# Patient Record
Sex: Female | Born: 1940 | Race: White | Hispanic: No | Smoking: Never smoker
Health system: Southern US, Community
[De-identification: ages and names within clinical notes are randomized; demographics above are authoritative.]

## PROBLEM LIST (undated history)

## (undated) DIAGNOSIS — M545 Low back pain, unspecified: Secondary | ICD-10-CM

## (undated) DIAGNOSIS — T7840XA Allergy, unspecified, initial encounter: Secondary | ICD-10-CM

## (undated) DIAGNOSIS — H269 Unspecified cataract: Secondary | ICD-10-CM

## (undated) DIAGNOSIS — Z9889 Other specified postprocedural states: Secondary | ICD-10-CM

## (undated) DIAGNOSIS — M2550 Pain in unspecified joint: Secondary | ICD-10-CM

## (undated) DIAGNOSIS — G4700 Insomnia, unspecified: Secondary | ICD-10-CM

## (undated) DIAGNOSIS — H9319 Tinnitus, unspecified ear: Secondary | ICD-10-CM

## (undated) DIAGNOSIS — R112 Nausea with vomiting, unspecified: Secondary | ICD-10-CM

## (undated) DIAGNOSIS — K219 Gastro-esophageal reflux disease without esophagitis: Secondary | ICD-10-CM

## (undated) DIAGNOSIS — R51 Headache: Secondary | ICD-10-CM

## (undated) DIAGNOSIS — E039 Hypothyroidism, unspecified: Secondary | ICD-10-CM

## (undated) DIAGNOSIS — I491 Atrial premature depolarization: Secondary | ICD-10-CM

## (undated) DIAGNOSIS — D649 Anemia, unspecified: Secondary | ICD-10-CM

## (undated) DIAGNOSIS — J189 Pneumonia, unspecified organism: Secondary | ICD-10-CM

## (undated) DIAGNOSIS — M1990 Unspecified osteoarthritis, unspecified site: Secondary | ICD-10-CM

## (undated) DIAGNOSIS — M2540 Effusion, unspecified joint: Secondary | ICD-10-CM

## (undated) HISTORY — DX: Anemia, unspecified: D64.9

## (undated) HISTORY — PX: CARPAL TUNNEL RELEASE: SHX101

## (undated) HISTORY — PX: CATARACT EXTRACTION: SUR2

## (undated) HISTORY — DX: Headache: R51

## (undated) HISTORY — DX: Allergy, unspecified, initial encounter: T78.40XA

## (undated) HISTORY — DX: Hypothyroidism, unspecified: E03.9

## (undated) HISTORY — DX: Unspecified cataract: H26.9

## (undated) HISTORY — DX: Gastro-esophageal reflux disease without esophagitis: K21.9

## (undated) HISTORY — DX: Low back pain: M54.5

## (undated) HISTORY — PX: ESOPHAGOGASTRODUODENOSCOPY: SHX1529

## (undated) HISTORY — PX: CHOLECYSTECTOMY: SHX55

## (undated) HISTORY — PX: TUBAL LIGATION: SHX77

## (undated) HISTORY — DX: Low back pain, unspecified: M54.50

---

## 2002-07-03 ENCOUNTER — Inpatient Hospital Stay (HOSPITAL_COMMUNITY): Admission: AD | Admit: 2002-07-03 | Discharge: 2002-07-09 | Payer: Self-pay | Admitting: Gastroenterology

## 2002-07-03 ENCOUNTER — Encounter: Payer: Self-pay | Admitting: Gastroenterology

## 2002-07-06 ENCOUNTER — Encounter: Payer: Self-pay | Admitting: Gastroenterology

## 2005-06-17 ENCOUNTER — Ambulatory Visit (HOSPITAL_BASED_OUTPATIENT_CLINIC_OR_DEPARTMENT_OTHER): Admission: RE | Admit: 2005-06-17 | Discharge: 2005-06-18 | Payer: Self-pay | Admitting: Orthopedic Surgery

## 2009-11-19 ENCOUNTER — Ambulatory Visit (HOSPITAL_BASED_OUTPATIENT_CLINIC_OR_DEPARTMENT_OTHER): Admission: RE | Admit: 2009-11-19 | Discharge: 2009-11-19 | Payer: Self-pay | Admitting: Orthopedic Surgery

## 2010-06-27 LAB — BASIC METABOLIC PANEL
BUN: 19 mg/dL (ref 6–23)
CO2: 22 mEq/L (ref 19–32)
Calcium: 8.6 mg/dL (ref 8.4–10.5)
Chloride: 109 mEq/L (ref 96–112)
Creatinine, Ser: 0.79 mg/dL (ref 0.4–1.2)
GFR calc Af Amer: >60 mL/min (ref >60)
GFR calc non Af Amer: >60 mL/min (ref >60)
Glucose, Bld: 124 mg/dL — ABNORMAL HIGH (ref 70–99)
Potassium: 3.9 mEq/L (ref 3.5–5.1)
Sodium: 137 mEq/L (ref 135–145)

## 2010-06-27 LAB — POCT HEMOGLOBIN-HEMACUE: Hemoglobin: 12.4 g/dL (ref 12.0–15.0)

## 2010-08-29 NOTE — Op Note (Signed)
NAMETRULA, FREDE                ACCOUNT NO.:  [id number]   MEDICAL RECORD NO.:  [id number]          PATIENT TYPE:  AMB   LOCATION:  DSC                          FACILITY:  [facility]   PHYSICIAN:  Cindee Salt, M.D.       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   OPERATION:  Decompression, left median nerve; release of flexor carpi  radialis; debridement of STT joint of left wrist.   SURGEON:  Cindee Salt, M.D.   ANESTHESIA:  General.   HISTORY:  The patient is a 70 year old female with a history of wrist pain,  numbness, tingling, EMG nerve conductions positive. She has STT arthritis  and symptoms of flexor carpi radialis tendonitis.   PROCEDURE:  The patient was brought to the operating room, where a general  anesthetic was carried out without difficulty. She was prepped using  DuraPrep in the supine position, with left arm free. The limb was  exsanguinated with an Esmarch bandage; tourniquet placed high on the arm and  was inflated to 150 mmHg. An incision was made in the palm, carried down  into the distal forearm for release of the carpal tunnel. The wound was  deepened.  The flexor retinaculum was identified proximally. The distal  forearm fascia was released.  A release was then formed to the ulnar side of  median nerve, from the main proximal to distal direction under direct  vision.  The nerve was released, and an hourglass deformity was apparent to  the nerve. The dissection was then carried underneath the carpal  retinaculum.  The palmar cutaneous branch of the median nerve was  identified. The flexor carpi radialis was then identified, opened from its  sheath and  released along its course from the ulnar side, taking care to  protect the motor branch of the median nerve. The tendon was lifted; a  significant area of fibrillation was present, with a large spicule of bone  present from the STT joint. This was debrided with a rongeur until smooth;  no further lesions were identified. The entire flexor carpi radialis was  released. The wound was then copiously irrigated with saline. The skin  closed with interrupted 5-0 nylon sutures. Sterile compressive dressing and  splint was applied. The patient tolerated the procedure well, and was taken  to the recovery room for observation in satisfactory condition. She was  discharged home, to return to the [facility] in 1 week on  Vicodin.           ______________________________  Cindee Salt, M.D.     GK/MEDQ  D:  06/17/2005  T:  06/18/2005  Job:  [id number]

## 2010-08-29 NOTE — Discharge Summary (Signed)
Frances Robertson, Frances Robertson                          ACCOUNT NO.:  [id number]   MEDICAL RECORD NO.:  [id number]                   PATIENT TYPE:  INP   LOCATION:  5152                                 FACILITY:  [facility]   PHYSICIAN:  Wilhemina Bonito. Marina Goodell, M.D. [facility]             DATE OF BIRTH:  January 06, 1941   DATE OF ADMISSION:  07/03/2002  DATE OF DISCHARGE:  07/09/2002                                 DISCHARGE SUMMARY   ADMISSION DIAGNOSES:  1. Post endoscopic retrograde cholangiopancreatography pancreatitis.  2. History of ampullary stenosis, status post endoscopic retrograde     cholangiopancreatography with sphincterotomy on July 03, 2002.  3. Mild hyperglycemia.  4. Hypothyroidism.  5. Chronic sinusitis, status post sinus surgery.  6. Mild depression.  7. Status post bilateral tubal ligation.  8. Status post cholecystectomy in 1997.  9. Normocytic anemia.  10.      Recent urinary tract infection diagnosed March 5.  The patient     poorly compliant with prescription for Keflex to treat urinary tract     infection.   DISCHARGE DIAGNOSIS:  Post endoscopic retrograde cholangiopancreatography  pancreatitis, resolved.   CONSULTATIONS:  None.   PROCEDURES:  She had the endoscopic retrograde cholangiopancreatography with  sphincterotomy and balloon pull-through on July 03, 2002.  Cholangiogram  findings included diffuse ductal dilatation involving common bile duct and  common hepatic duct.  The ampullary orifice was small, difficult to  cannulate, and distal common bile duct was tortuous.  There was good biliary  drainage following the sphincterotomy.   BRIEF HISTORY:  The patient is a pleasant and generally healthy 70 year old  white female who had been referred by her primary care physician, Dr. Lewis Moccasin, to [facility].  Going back the last few months, the patient had been  having intermittent epigastric pain, nausea, and vomiting.  On ER visit, she  had documented abnormal LFT's,  amylase, and lipase.  She had undergone  cholecystectomy more than 10 years previously.  A CT scan was obtained and  showed some dilation of the common bile duct with tapering as it approached  the ampulla.  She was referred to Dr. Jarold Motto for evaluation and  ultimately to Dr. Russella Dar for an ERCP.  That study was performed on the  morning of March 22.  Following the uncomplicated procedure, she developed  upper abdominal pain and was admitted for presumed post ERCP pancreatitis.   LABORATORY DATA:  Hemoglobin 10.8, hematocrit 30.9, white blood cell count  5.4, platelets of 152.  Maximum white blood cell count was 11.4.  Differential did reveal left shift with increased neutrophils and diminished  lymphocytes.  Sodium nadir of 131.  It was 135 at discharge.  Potassium  nadir of 3.4.  It was 4.4 at discharge.  Glucose ranged from 118 up to 154.  BUN on admission was 15, creatinine 0.6; at discharge 4 and 0.6,  respectively.  Calcium  low at 8.1.  Albumin low at 2.4.  Total bilirubin was  0.5 to 0.7.  Alkaline phosphatase ranged 77 to 111.  AST peak of 187, 30 at  discharge.  ALT peak of 286, 72 at discharge.  Amylase peak of 2901, 83 at  discharge.  Lipase peak 2155, 29 at discharge.  Urinalysis was negative with  the exception of 3-6 white blood cells per high-powered field.  On urine  culture, multiple species grew out with no uropathogens isolated.   HOSPITAL COURSE:  The patient was admitted to a nonmonitored unit for bowel  rest, IV hydration, and analgesics and antiemetics as needed.  Amylase and  lipase were elevated and confirmed the presumed diagnosis on admission of  post ERCP pancreatitis.  Both the amylase and lipase and the LFT's were  followed for the course of her hospitalization and peaked and then  ultimately were at or near normal by the time of discharge.   Within 24 hours, the patient was feeling a little bit better but was still  quite tender and uncomfortable.  By  hospital day #3, pain was better and she  was hungry but she was kept on clears for the time being.  Hospital day #3,  #4 and #5 she spiked temperatures as high as 101.3.  Urine cultures were  unremarkable.  Chest films and abdominal films did not confirm any active  lung infiltrate.  Ultimately, the fevers resolved.   The patient's pain gradually improved to the point where we were able to  advance her from clear liquids to a low-fat diet, all of which she  tolerated.  By March 28, she had not required much in the way of narcotic  analgesic.  She was stable and deemed fit for discharge in improved  condition by Dr. Marina Goodell.   The patient was discharged on a day when the office was closed and therefore  she was advised that she would be contacted with an appointment to return to  see Dr. Russella Dar within the next couple of weeks.  Microcytic anemia has been  noted during this admission and should be addressed when she comes back to  see Dr. Russella Dar in the office, at which time a colonoscopy can be arranged.  She has never undergone colonoscopic evaluation before.   DISCHARGE MEDICATIONS:  1. Prevacid 30 mg p.o. daily.  2. Synthroid 125 mcg p.o. daily.  3. Trazodone 100 mg p.o. at h.s.  4. Calcium 1000 mg daily.  5. Also suggested was a multivitamin to be taken daily.   DIET:  Low fat.   PAIN MANAGEMENT:  She could use Tylenol regular or extra strength as needed.   ACTIVITY:  She was okay to return to light duty work April 1 through 7 and  to full duty work on April 8.     Brett Canales, P.A. [facility]                    John N. Marina Goodell, M.D. [facility]    SG/MEDQ  D:  08/08/2002  T:  08/09/2002  Job:  [id number]   cc:   Lewis Moccasin  [address]  [place] [address]  Fax: [phone]

## 2010-08-29 NOTE — H&P (Signed)
Frances Robertson, Frances Robertson                          ACCOUNT NO.:  [id number]   MEDICAL RECORD NO.:  [id number]                   PATIENT TYPE:  INP   LOCATION:  5508                                 FACILITY:  [facility]   PHYSICIAN:  Malcolm T. Russella Dar, M.D. [facility]          DATE OF BIRTH:  02/25/1941   DATE OF ADMISSION:  07/03/2002  DATE OF DISCHARGE:                                HISTORY & PHYSICAL   CHIEF COMPLAINT:  Intermittent attacks of epigastric pain.   HISTORY OF PRESENT ILLNESS:  The patient is a 70 year old white female who  underwent ERCP and sphincterotomy this morning for evaluation of  intermittent epigastric pain, nausea, and vomiting.  These attacks have been  rather severe and associated with documented abnormal LFTs, amylase, and  lipase in the past.  She has had several ER visits over the past year for  these episodes.  She is status post cholecystectomy in 1997.  She underwent  recent CT scan per her primary physician, Dr. Lewis Moccasin, in [place] in  January 2004 which did show a common bile duct of 1 cm with tapering near  the ampulla.  This exam was otherwise negative.  She was seen in the office  by Dr. Jarold Motto for GI consultation and then referred to Dr. Russella Dar for  ERCP.   ERCP today consistent with ampullary stenosis with difficult cannulation.  Balloon pull-through was done x 3 and sphincterotomy.  The patient developed  upper abdominal pain after the procedure and is admitted at this time for  observation and pain control with concern for post ERCP pancreatitis.  At  this time, the patient is still sedated and not offering much history.   CURRENT MEDICATIONS:  1. Prevacid 30 mg p.o. daily.  2. Synthroid 125 mcg daily.  3. Trazodone 100 mg q.h.s.  4. Keflex for UTI.   ALLERGIES:  SELDANE.   PAST MEDICAL HISTORY:  1. Chronic sinusitis.  She has had a prior sinus surgery.  2. Hypothyroidism.  3. Mild depression.  4. Status post bilateral tubal ligation.  5. Status post cholecystectomy in 1997.   FAMILY HISTORY:  Negative for GI disease.   SOCIAL HISTORY:  The patient is divorced; son lives with her.  She is  employed full time.  She is an ex-smoker and no regular ETOH.   REVIEW OF SYSTEMS:  Unable to obtain at this time in detail due to her  sedated state.   PHYSICAL EXAMINATION:  GENERAL:  Well-developed white female who is somewhat  groggy but able to be aroused.  VITAL SIGNS:  Temperature 97.8, blood pressure 147/100, pulse 90.  HEENT:  PERRLA.  EOMI.  Sclerae anicteric.  NECK:  Supple without nodes.  There is no JVD.  CARDIOVASCULAR:  Regular rate and rhythm with S1 and S2.  PULMONARY:  Clear to auscultation and percussion.  ABDOMEN:  Soft. Bowel sounds are present but hypoactive.  She is tender  across the upper abdomen with some guarding.  There is no mass or  hepatosplenomegaly.  RECTAL:  Exam not done at this time.  EXTREMITIES:  Without clubbing, cyanosis, or edema.   IMPRESSION:  23. A 70 year old white female with abdominal pain post endoscopic retrograde     cholangiopancreatography and sphincterotomy done for ampullary stenosis.     Rule out post endoscopic retrograde cholangiopancreatography     pancreatitis.  2. Status post cholecystectomy in 1997.  3. Gastroesophageal reflux disease.  4. Status post bilateral tubal ligation.  5. Hypothyroidism.  6. Depression.   PLAN:  The patient is admitted to the service fo Dr .Claudette Head for IV  fluid hydration, pain control, bowel rest, and baseline labs.  For further  details, please see the orders.     Amy Esterwood, P.A.-C. [facility]                Malcolm T. Russella Dar, M.D. [facility]    AE/MEDQ  D:  07/03/2002  T:  07/03/2002  Job:  [id number]   cc:   Lewis Moccasin, M.D.  [facility]

## 2012-03-14 ENCOUNTER — Ambulatory Visit: Payer: Self-pay | Admitting: Cardiology

## 2012-03-18 ENCOUNTER — Ambulatory Visit (INDEPENDENT_AMBULATORY_CARE_PROVIDER_SITE_OTHER): Payer: Medicare Other | Admitting: Cardiology

## 2012-03-18 ENCOUNTER — Encounter: Payer: Self-pay | Admitting: Cardiology

## 2012-03-18 VITALS — BP 144/80 | HR 73 | Ht 62.0 in | Wt 130.1 lb

## 2012-03-18 DIAGNOSIS — I491 Atrial premature depolarization: Secondary | ICD-10-CM

## 2012-03-18 NOTE — Patient Instructions (Addendum)
The current medical regimen is effective;  continue present plan and medications.  Follow up as needed 

## 2012-03-18 NOTE — Progress Notes (Signed)
HPI The patient presents for evaluation of premature atrial contractions. She unfortunately suffered the loss of her 72 year old son who died suddenly this. There was no autopsy the cause of this was not clear per the patient. She was noticed recently on an EKG to have premature atrial contractions. She occasionally seems to feel some palpitations but she doesn't have any severe symptoms related to this. She does not describe presyncope or syncope. She does not have chest pressure, neck or arm discomfort. She works full-time. She has never had shortness of breath and has no PND or orthopnea. She has never had reason for stress testing or other cardiac evaluation.  Allergies  Allergen Reactions  . Celandine (Chelidonium Majus)   . Celebrex (Celecoxib)   . Neosporin (Neomycin-Bacitracin Zn-Polymyx)     Current Outpatient Prescriptions  Medication Sig Dispense Refill  . atenolol (TENORMIN) 50 MG tablet Take 50 mg by mouth daily.      Marland Kitchen gabapentin (NEURONTIN) 300 MG capsule Take 300 mg by mouth 3 (three) times daily.      Marland Kitchen levothyroxine (SYNTHROID, LEVOTHROID) 88 MCG tablet Take 88 mcg by mouth daily.      Marland Kitchen omeprazole (PRILOSEC) 40 MG capsule Take 40 mg by mouth daily.      . traMADol (ULTRAM) 50 MG tablet Take 50 mg by mouth 2 (two) times daily. Take 2 tabs bid for 5 days      . zolpidem (AMBIEN) 10 MG tablet Take 10 mg by mouth at bedtime as needed.      . [DISCONTINUED] omeprazole (PRILOSEC) 20 MG capsule Take 20 mg by mouth daily.        Past Medical History  Diagnosis Date  . Hypertension   . GERD (gastroesophageal reflux disease)   . Other specified disorders of thyroid   . Unspecified hypothyroidism   . Anemia, unspecified   . Cardiac dysrhythmia, unspecified   . Contact dermatitis and other eczema, due to unspecified cause     Past Surgical History  Procedure Date  . Diagnostic mammogram 2010    Family History  Problem Relation Age of Onset  . Heart attack Mother   .  Stroke Father   . Cancer Other     History   Social History  . Marital Status: Married    Spouse Name: N/A    Number of Children: N/A  . Years of Education: N/A   Occupational History  . Not on file.   Social History Main Topics  . Smoking status: Never Smoker   . Smokeless tobacco: Not on file  . Alcohol Use: No  . Drug Use: No  . Sexually Active:    Other Topics Concern  . Not on file   Social History Narrative  . No narrative on file    ROS:  Positive for joint pains. Otherwise as stated in the HPI and negative for all other systems.  PHYSICAL EXAM BP 144/80  Pulse 73  Ht 5\' 2"  (1.575 m)  Wt 130 lb 1.9 oz (59.022 kg)  BMI 23.80 kg/m2 GENERAL:  Well appearing HEENT:  Pupils equal round and reactive, fundi not visualized, oral mucosa unremarkable NECK:  No jugular venous distention, waveform within normal limits, carotid upstroke brisk and symmetric, no bruits, no thyromegaly LYMPHATICS:  No cervical, inguinal adenopathy LUNGS:  Clear to auscultation bilaterally BACK:  No CVA tenderness CHEST:  Unremarkable HEART:  PMI not displaced or sustained,S1 and S2 within normal limits, no S3, no S4, no clicks, no rubs,  no murmurs ABD:  Flat, positive bowel sounds normal in frequency in pitch, no bruits, no rebound, no guarding, no midline pulsatile mass, no hepatomegaly, no splenomegaly EXT:  2 plus pulses throughout, no edema, no cyanosis no clubbing SKIN:  No rashes no nodules NEURO:  Cranial nerves II through XII grossly intact, motor grossly intact throughout PSYCH:  Cognitively intact, oriented to person place and time  EKG:  NSR, rate 71, axis WNL, intervals WNL, poor anterior R wave progression, borderline low voltage on the chest leads.  ASSESSMENT AND PLAN   Atrial premature beats - The patient has no symptoms related to this. She has a normal physical examination. Her EKG demonstrates only borderline findings. I do not think this is related to the sudden  death of her son (that is I do not suggest that this is an inherited problem) although it certainly could be related to the stress of that situation. At this point we had a long discussion about this but I do not think further cardiovascular testing is warranted.

## 2012-03-21 DIAGNOSIS — I491 Atrial premature depolarization: Secondary | ICD-10-CM | POA: Insufficient documentation

## 2012-09-14 ENCOUNTER — Encounter: Payer: Self-pay | Admitting: Nurse Practitioner

## 2012-09-14 ENCOUNTER — Ambulatory Visit (INDEPENDENT_AMBULATORY_CARE_PROVIDER_SITE_OTHER): Payer: Medicare Other | Admitting: Nurse Practitioner

## 2012-09-14 VITALS — BP 133/67 | HR 60 | Ht 62.5 in | Wt 132.0 lb

## 2012-09-14 DIAGNOSIS — G43019 Migraine without aura, intractable, without status migrainosus: Secondary | ICD-10-CM | POA: Insufficient documentation

## 2012-09-14 NOTE — Progress Notes (Signed)
HPI: Patient returns for followup after last visit with Dr. Anne Hahn 03/02/2012 for headache. At that time her son who died suddenly and unexpectedly and she had been under a lot of stress. She was  also having episodes of irregular heartbeat but she did not have any syncopal episodes. She was having difficulty sleeping at night and a lot of anxiety during the day. She was evaluated by cardiology who felt no further workup was needed.  She continues to work. She reports today that her headache preventatives are beneficial in that she is doing better.  ROS:  Insomnia,  headache  Physical Exam General: well developed, well nourished, seated, in no evident distress Head: head normocephalic and atraumatic. Oropharynx benign Neck: supple with no carotid  bruits Cardiovascular: regular rate and rhythm, no murmurs  Neurologic Exam Mental Status: Awake and fully alert. Oriented to place and time. Follows all commands. Speech and language normal.   Cranial Nerves: Pupils equal, briskly reactive to light. Extraocular movements full without nystagmus. Visual fields full to confrontation. Hearing intact and symmetric to finger snap.  Face, tongue, palate move normally and symmetrically. Neck flexion and extension normal.  Motor: Normal bulk and tone. Normal strength in all tested extremity muscles.No focal weakness Sensory.: intact to touch and pinprick and vibratory.  Coordination: Rapid alternating movements normal in all extremities. Finger-to-nose and heel-to-shin performed accurately bilaterally. Gait and Station: Arises from chair without difficulty. Stance is normal. Gait demonstrates normal stride length and balance . Able to heel, toe and tandem walk without difficulty.  Reflexes: 2+ and symmetric. Toes downgoing.     ASSESSMENT: History of headache doing well on gabapentin and Topamax     PLAN: Will continue medications, she says she does not need refills She will f/u yearly  Frances Robertson, GNP-BC APRN

## 2012-09-14 NOTE — Progress Notes (Signed)
I have read the note, and I agree with the clinical assessment and plan.  

## 2012-09-14 NOTE — Patient Instructions (Addendum)
Continue current medications as previously ordered by Dr. Anne Hahn Follow up 1 yr and prn

## 2012-09-30 ENCOUNTER — Other Ambulatory Visit: Payer: Self-pay | Admitting: Neurology

## 2012-10-03 NOTE — Telephone Encounter (Signed)
Dr Anne Hahn is out of the office, forwarding request to Dr Terrace Arabia, [facility]

## 2012-11-10 ENCOUNTER — Other Ambulatory Visit: Payer: Self-pay | Admitting: Neurology

## 2012-11-12 ENCOUNTER — Other Ambulatory Visit: Payer: Self-pay | Admitting: Neurology

## 2012-12-19 ENCOUNTER — Other Ambulatory Visit: Payer: Self-pay | Admitting: Neurology

## 2012-12-20 ENCOUNTER — Other Ambulatory Visit: Payer: Self-pay

## 2012-12-20 MED ORDER — GABAPENTIN 300 MG PO CAPS: 300.0000 mg | ORAL_CAPSULE | Freq: Four times a day (QID) | ORAL | Status: DC

## 2012-12-20 MED ORDER — TRAMADOL HCL 50 MG PO TABS: 50.0000 mg | ORAL_TABLET | ORAL | Status: DC | PRN

## 2012-12-20 MED ORDER — DIAZEPAM 5 MG PO TABS: 5.0000 mg | ORAL_TABLET | Freq: Two times a day (BID) | ORAL | Status: DC

## 2012-12-20 MED ORDER — TOPIRAMATE 100 MG PO TABS: 100.0000 mg | ORAL_TABLET | Freq: Every evening | ORAL | Status: DC

## 2012-12-20 NOTE — Telephone Encounter (Signed)
Rx's signed and faxed.

## 2013-04-04 ENCOUNTER — Ambulatory Visit: Payer: Medicare Other | Admitting: Podiatrist

## 2013-06-26 ENCOUNTER — Other Ambulatory Visit: Payer: Self-pay | Admitting: Neurology

## 2013-06-26 NOTE — Telephone Encounter (Signed)
Rx signed and faxed.

## 2013-08-07 ENCOUNTER — Telehealth: Payer: Self-pay | Admitting: Cardiology

## 2013-08-07 NOTE — Telephone Encounter (Signed)
Per Dr Hochrein's review - In the absence of symptoms no further work up is needed.

## 2013-08-07 NOTE — Telephone Encounter (Signed)
New message     Get we get the ekg's faxed last week?  Patient had EKG's changes

## 2013-08-07 NOTE — Telephone Encounter (Signed)
Office visit note and EKG's were received and given to Dr Percival Spanish for review.  Of note -EKG's received and those from 2012-13 are very similar.

## 2013-08-07 NOTE — Telephone Encounter (Signed)
Randy aware.

## 2013-09-14 ENCOUNTER — Ambulatory Visit (INDEPENDENT_AMBULATORY_CARE_PROVIDER_SITE_OTHER): Payer: Medicare Other | Admitting: Neurology

## 2013-09-14 ENCOUNTER — Encounter: Payer: Self-pay | Admitting: Neurology

## 2013-09-14 ENCOUNTER — Encounter (INDEPENDENT_AMBULATORY_CARE_PROVIDER_SITE_OTHER): Payer: Self-pay

## 2013-09-14 VITALS — Ht 62.25 in | Wt 135.0 lb

## 2013-09-14 DIAGNOSIS — G43019 Migraine without aura, intractable, without status migrainosus: Secondary | ICD-10-CM

## 2013-09-14 NOTE — Progress Notes (Signed)
Reason for visit: Headache  Frances Robertson is an 73 y.o. female  History of present illness:  Frances Robertson is a 73 year old white female with a history of migraine headaches. She indicates that over the last year, she has had a lot of allergy symptoms and sinusitis issues. She has been on several courses of antibiotics, and she has been set up for sinus x-rays, but she never had x-rays done. With the sinus issues, she has had some frontal headaches and some dizziness. Otherwise, she indicates that her migraines are under relatively good control, having only 2 or 3 headaches a month. She is on Topamax at 100 mg dosing at night. She is tolerating the medication well. She takes gabapentin as well taking 300 mg 4 times daily. She believes this also helps her headache. She returns for an evaluation.  Past Medical History  Diagnosis Date  . Hypertension     Borderline  . GERD (gastroesophageal reflux disease)   . Unspecified hypothyroidism   . Anemia, unspecified   . Contact dermatitis and other eczema, due to unspecified cause   . [id number](829.9)     Past Surgical History  Procedure Laterality Date  . Carpal tunnel release      bilateral  . Cholecystectomy    . Cataract extraction      bilateral  . Tubal ligation Bilateral     Family History  Problem Relation Age of Onset  . Venous thrombosis Mother 3  . Stroke Father 39    Sisters x 2 with breast cancer, brothers x 2 died with lung cancer  . Cancer Other   . Sudden death Son 67  . Cancer - Lung Brother   . Cancer - Lung Brother     Social history:  reports that she has never smoked. She has never used smokeless tobacco. She reports that she does not drink alcohol or use illicit drugs.    Allergies  Allergen Reactions  . Celandine [Chelidonium Majus]   . Celebrex [Celecoxib]   . Neosporin [Neomycin-Bacitracin Zn-Polymyx]     Medications:  Current Outpatient Prescriptions on File Prior to Visit  Medication Sig  Dispense Refill  . atenolol (TENORMIN) 50 MG tablet Take 50 mg by mouth daily.      Marland Kitchen gabapentin (NEURONTIN) 300 MG capsule Take 1 capsule (300 mg total) by mouth 4 (four) times daily.  360 capsule  3  . levothyroxine (SYNTHROID, LEVOTHROID) 88 MCG tablet Take 88 mcg by mouth daily.      Marland Kitchen omeprazole (PRILOSEC) 40 MG capsule Take 40 mg by mouth daily.      Marland Kitchen topiramate (TOPAMAX) 100 MG tablet Take 1 tablet (100 mg total) by mouth every evening.  90 tablet  3  . traMADol (ULTRAM) 50 MG tablet TAKE 1 TABLET BY MOUTH EVERY 4 HOURS AS NEEDED  180 tablet  1  . zolpidem (AMBIEN) 10 MG tablet Take 10 mg by mouth at bedtime as needed.       No current facility-administered medications on file prior to visit.    ROS:  Out of a complete 14 system review of symptoms, the patient complains only of the following symptoms, and all other reviewed systems are negative.  Chest pain Insomnia Headache  Height 5' 2.25" (1.581 m), weight 135 lb (61.236 kg).  Physical Exam  General: The patient is alert and cooperative at the time of the examination.  Skin: No significant peripheral edema is noted.   Neurologic Exam  Mental status: The  patient is oriented x 3.  Cranial nerves: Facial symmetry is present. Speech is normal, no aphasia or dysarthria is noted. Extraocular movements are full. Visual fields are full.  Motor: The patient has good strength in all 4 extremities.  Sensory examination: Soft touch sensation is symmetric on the face, arms, and legs.  Coordination: The patient has good finger-nose-finger and heel-to-shin bilaterally.  Gait and station: The patient has a normal gait. Tandem gait is normal. Romberg is negative. No drift is seen.  Reflexes: Deep tendon reflexes are symmetric.   Assessment/Plan:  1. Migraine headache  2. Chronic sinusitis  The patient may need to get the sinus issue evaluated and properly treated. The patient is doing well with her migraine headaches, we  will continue the Topamax for now, the patient will followup in 6-8 months.  Frances Alexanders MD 09/14/2013 5:50 PM  [facility] [address] [place], [place] [address]  Phone [phone] Fax [phone]

## 2013-09-14 NOTE — Patient Instructions (Signed)
Migraine Headache A migraine headache is an intense, throbbing pain on one or both sides of your head. A migraine can last for 30 minutes to several hours. CAUSES  The exact cause of a migraine headache is not always known. However, a migraine may be caused when nerves in the brain become irritated and release chemicals that cause inflammation. This causes pain. Certain things may also trigger migraines, such as:  Alcohol.  Smoking.  Stress.  Menstruation.  Aged cheeses.  Foods or drinks that contain nitrates, glutamate, aspartame, or tyramine.  Lack of sleep.  Chocolate.  Caffeine.  Hunger.  Physical exertion.  Fatigue.  Medicines used to treat chest pain (nitroglycerine), birth control pills, estrogen, and some blood pressure medicines. SIGNS AND SYMPTOMS  Pain on one or both sides of your head.  Pulsating or throbbing pain.  Severe pain that prevents daily activities.  Pain that is aggravated by any physical activity.  Nausea, vomiting, or both.  Dizziness.  Pain with exposure to bright lights, loud noises, or activity.  General sensitivity to bright lights, loud noises, or smells. Before you get a migraine, you may get warning signs that a migraine is coming (aura). An aura may include:  Seeing flashing lights.  Seeing bright spots, halos, or zig-zag lines.  Having tunnel vision or blurred vision.  Having feelings of numbness or tingling.  Having trouble talking.  Having muscle weakness. DIAGNOSIS  A migraine headache is often diagnosed based on:  Symptoms.  Physical exam.  A CT scan or MRI of your head. These imaging tests cannot diagnose migraines, but they can help rule out other causes of headaches. TREATMENT Medicines may be given for pain and nausea. Medicines can also be given to help prevent recurrent migraines.  HOME CARE INSTRUCTIONS  Only take over-the-counter or prescription medicines for pain or discomfort as directed by your  health care provider. The use of long-term narcotics is not recommended.  Lie down in a dark, quiet room when you have a migraine.  Keep a journal to find out what may trigger your migraine headaches. For example, write down:  What you eat and drink.  How much sleep you get.  Any change to your diet or medicines.  Limit alcohol consumption.  Quit smoking if you smoke.  Get 7 9 hours of sleep, or as recommended by your health care provider.  Limit stress.  Keep lights dim if bright lights bother you and make your migraines worse. SEEK IMMEDIATE MEDICAL CARE IF:   Your migraine becomes severe.  You have a fever.  You have a stiff neck.  You have vision loss.  You have muscular weakness or loss of muscle control.  You start losing your balance or have trouble walking.  You feel faint or pass out.  You have severe symptoms that are different from your first symptoms. MAKE SURE YOU:   Understand these instructions.  Will watch your condition.  Will get help right away if you are not doing well or get worse. Document Released: 03/30/2005 Document Revised: 01/18/2013 Document Reviewed: 12/05/2012 Exit[employer] Patient Information 2014 [employer].  

## 2013-11-07 ENCOUNTER — Other Ambulatory Visit: Payer: Self-pay

## 2013-11-07 MED ORDER — TOPIRAMATE 100 MG PO TABS: 100.0000 mg | ORAL_TABLET | Freq: Every evening | ORAL | Status: DC

## 2013-12-25 ENCOUNTER — Other Ambulatory Visit: Payer: Self-pay

## 2013-12-25 MED ORDER — TRAMADOL HCL 50 MG PO TABS: ORAL_TABLET | ORAL | Status: DC

## 2013-12-26 ENCOUNTER — Other Ambulatory Visit: Payer: Self-pay

## 2013-12-26 MED ORDER — GABAPENTIN 300 MG PO CAPS: 300.0000 mg | ORAL_CAPSULE | Freq: Four times a day (QID) | ORAL | Status: DC

## 2014-01-30 ENCOUNTER — Telehealth: Payer: Self-pay | Admitting: Neurology

## 2014-01-30 NOTE — Telephone Encounter (Signed)
Patient calling to request refill of Topamax to be sent to her pharmacy at [place], patient was confused because she was told that Topamax was a narcotic by her pharmacy and that it couldn't be called in. Please return call and advise.

## 2014-01-30 NOTE — Telephone Encounter (Signed)
A one year Rx for Topamax was sent on 11/07/13.  This is not a controlled substance medication.  I called the pharmacy.  Spoke with Clair Gulling.  He said they do have the Rx on file, but it was too soon to fill when she called for it.  He has reprocessed the Rx and will fill it today.  They will notify patient when it's ready for pick up.  I called the patient back, got no answer.  Left message.

## 2014-01-30 NOTE — Telephone Encounter (Signed)
Patient calling back and stated Pharmacy told her topiramate (TOPAMAX) 100 MG tablet was a controlled substance and they would not refill without written Rx.  Patient stated she did receive Janett Billow, CPT message today, but Pharmacy is relaying different information to her.  Please call on cell # [phone] and advise.

## 2014-01-30 NOTE — Telephone Encounter (Signed)
I called the pharmacy.  Spoke with Pharmacist.  They said no one there has told the patient that Topamax is controlled, because it is not.  It is there ready for pick up.  I called the patent back.  Relayed info from pharmacy.  She was adamant they would not fill Topamax without a written Rx because it is controlled.  Explained this was not a controlled med, and perhaps they misunderstood what she was asking for.  Advised pharmacist said Rx is ready.  I verified she was asking about Topamax, and verified pharmacy name, city and phone number.  She agreed all info was correct. Said she is driving up there in 4 minutes and is going to ask him if I really called.  I told her if she has any issues when she gets to the pharmacy, they may contact our office, and we'll be happy to do whatever we can to get it resolved.

## 2014-02-28 ENCOUNTER — Encounter: Payer: Self-pay | Admitting: Neurology

## 2014-03-06 ENCOUNTER — Encounter: Payer: Self-pay | Admitting: Neurology

## 2014-03-16 ENCOUNTER — Ambulatory Visit: Payer: Medicare Other | Admitting: Neurology

## 2014-06-25 ENCOUNTER — Other Ambulatory Visit (HOSPITAL_COMMUNITY): Payer: Self-pay | Admitting: Neurosurgery

## 2014-07-12 ENCOUNTER — Encounter (HOSPITAL_COMMUNITY): Payer: Self-pay

## 2014-07-12 ENCOUNTER — Ambulatory Visit (INDEPENDENT_AMBULATORY_CARE_PROVIDER_SITE_OTHER): Payer: Medicare Other | Admitting: Neurology

## 2014-07-12 ENCOUNTER — Encounter (HOSPITAL_COMMUNITY)
Admission: RE | Admit: 2014-07-12 | Discharge: 2014-07-12 | Disposition: A | Discharge status: Home / Self Care | Payer: Medicare Other | Source: Ambulatory Visit | Attending: Neurosurgery | Admitting: Neurosurgery

## 2014-07-12 ENCOUNTER — Encounter: Payer: Self-pay | Admitting: Neurology

## 2014-07-12 VITALS — BP 151/72 | HR 68 | Ht 63.0 in | Wt 133.4 lb

## 2014-07-12 DIAGNOSIS — M545 Low back pain, unspecified: Secondary | ICD-10-CM | POA: Insufficient documentation

## 2014-07-12 DIAGNOSIS — G43019 Migraine without aura, intractable, without status migrainosus: Secondary | ICD-10-CM | POA: Diagnosis not present

## 2014-07-12 DIAGNOSIS — M5440 Lumbago with sciatica, unspecified side: Secondary | ICD-10-CM

## 2014-07-12 DIAGNOSIS — M5126 Other intervertebral disc displacement, lumbar region: Secondary | ICD-10-CM | POA: Diagnosis not present

## 2014-07-12 DIAGNOSIS — Z01812 Encounter for preprocedural laboratory examination: Secondary | ICD-10-CM | POA: Insufficient documentation

## 2014-07-12 HISTORY — DX: Other specified postprocedural states: Z98.890

## 2014-07-12 HISTORY — DX: Pneumonia, unspecified organism: J18.9

## 2014-07-12 HISTORY — DX: Other specified postprocedural states: R11.2

## 2014-07-12 HISTORY — DX: Unspecified osteoarthritis, unspecified site: M19.90

## 2014-07-12 HISTORY — DX: Pain in unspecified joint: M25.50

## 2014-07-12 HISTORY — DX: Effusion, unspecified joint: M25.40

## 2014-07-12 HISTORY — DX: Tinnitus, unspecified ear: H93.19

## 2014-07-12 HISTORY — DX: Insomnia, unspecified: G47.00

## 2014-07-12 HISTORY — DX: Atrial premature depolarization: I49.1

## 2014-07-12 LAB — SURGICAL PCR SCREEN
MRSA, PCR: NEGATIVE
Staphylococcus aureus: NEGATIVE

## 2014-07-12 LAB — BASIC METABOLIC PANEL
Anion gap: 8 (ref 5–15)
BUN: 16 mg/dL (ref 6–23)
CO2: 25 mmol/L (ref 19–32)
Calcium: 9.5 mg/dL (ref 8.4–10.5)
Chloride: 107 mmol/L (ref 96–112)
Creatinine, Ser: 0.7 mg/dL (ref 0.50–1.10)
GFR, EST NON AFRICAN AMERICAN: 83 mL/min — AB (ref >90)
Glucose, Bld: 105 mg/dL — ABNORMAL HIGH (ref 70–99)
Potassium: 4.5 mmol/L (ref 3.5–5.1)
SODIUM: 140 mmol/L (ref 135–145)

## 2014-07-12 LAB — CBC
HCT: 34.4 % — ABNORMAL LOW (ref 36.0–46.0)
Hemoglobin: 11 g/dL — ABNORMAL LOW (ref 12.0–15.0)
MCH: 28.4 pg (ref 26.0–34.0)
MCHC: 32 g/dL (ref 30.0–36.0)
MCV: 88.7 fL (ref 78.0–100.0)
PLATELETS: 168 10*3/uL (ref 150–400)
RBC: 3.88 MIL/uL (ref 3.87–5.11)
RDW: 14 % (ref 11.5–15.5)
WBC: 4.8 10*3/uL (ref 4.0–10.5)

## 2014-07-12 MED ORDER — TRAMADOL HCL 50 MG PO TABS: ORAL_TABLET | ORAL | Status: DC

## 2014-07-12 MED ORDER — GABAPENTIN 300 MG PO CAPS: 300.0000 mg | ORAL_CAPSULE | Freq: Four times a day (QID) | ORAL | Status: DC

## 2014-07-12 NOTE — Progress Notes (Signed)
Reason for visit: Migraine headache  Frances Robertson is an 74 y.o. female  History of present illness:  Frances Robertson is a 74 year old right-handed white female with a history of migraine headaches. The patient indicates that her headaches are doing relatively well, she is having 2 or 3 headaches a month that are severe. The patient is able to control the headaches within several hours. She is having significant issues at this time with low back pain, and some numbness into the legs. The patient is planning on having back surgery to be done by Dr. Arnoldo Morale within the next month. She indicates that her headaches are bifrontal in nature, and they usually last 2 or 3 hours when they do occur. The patient is on gabapentin for the back and for the headache, and she takes Topamax 100 mg at night. She is tolerating medications fairly well.  Past Medical History  Diagnosis Date  . Anemia, unspecified   . Low back pain     herniated disc  . Unspecified hypothyroidism     takes Synthroid daily  . GERD (gastroesophageal reflux disease)     takes Omeprazole daily  . Insomnia     takes Ambien nightly  . PONV (postoperative nausea and vomiting)   . Family history of adverse reaction to anesthesia     brother gets very sick  . PAC (premature atrial contraction)   . Pneumonia hx of-as a child  . Bronchitis 5-84yrs ago  . Tinnitus   . Headache(784.0)     takes Topamax daily and Atenolol daily;takes Tramadol daily as well  . Weakness     numbness related to back   . Arthritis   . Joint pain   . Joint swelling     Past Surgical History  Procedure Laterality Date  . Carpal tunnel release Bilateral   . Cholecystectomy    . Cataract extraction Bilateral   . Tubal ligation Bilateral   . Esophagogastroduodenoscopy      Family History  Problem Relation Age of Onset  . Venous thrombosis Mother 4  . Stroke Father 64    Sisters x 2 with breast cancer, brothers x 2 died with lung cancer  .  Cancer Other   . Sudden death Son 5  . Cancer - Lung Brother   . Cancer - Lung Brother     Social history:  reports that she has never smoked. She has never used smokeless tobacco. She reports that she does not drink alcohol or use illicit drugs.    Allergies  Allergen Reactions  . Celandine [Chelidonium Majus] Nausea And Vomiting  . Celebrex [Celecoxib] Nausea And Vomiting  . Hydrocodone Other (See Comments)    sick  . Neosporin [Neomycin-Bacitracin Zn-Polymyx] Rash  . Other Rash and Other (See Comments)    Icy Hot Patches  . Oxycodone Nausea And Vomiting    Medications:  Prior to Admission medications   Medication Sig Start Date End Date Taking? Authorizing Provider  atenolol (TENORMIN) 50 MG tablet Take 50 mg by mouth daily.    Historical Provider, MD  gabapentin (NEURONTIN) 300 MG capsule Take 1 capsule (300 mg total) by mouth 4 (four) times daily. 12/26/13   Kathrynn Ducking, MD  levothyroxine (SYNTHROID, LEVOTHROID) 88 MCG tablet Take 88 mcg by mouth daily.    Historical Provider, MD  omeprazole (PRILOSEC) 40 MG capsule Take 40 mg by mouth daily.    Historical Provider, MD  topiramate (TOPAMAX) 100 MG tablet Take 1 tablet (  100 mg total) by mouth every evening. 11/07/13   Kathrynn Ducking, MD  traMADol (ULTRAM) 50 MG tablet TAKE 1 TABLET BY MOUTH EVERY 4 HOURS AS NEEDED 12/25/13   Kathrynn Ducking, MD  zolpidem (AMBIEN) 10 MG tablet Take 10 mg by mouth at bedtime as needed.    Historical Provider, MD    ROS:  Out of a complete 14 system review of symptoms, the patient complains only of the following symptoms, and all other reviewed systems are negative.  Back pain Headaches Numbness  Blood pressure 151/72, pulse 68, height 5\' 3"  (1.6 m), weight 133 lb 6.4 oz (60.51 kg).  Physical Exam  General: The patient is alert and cooperative at the time of the examination.  Skin: No significant peripheral edema is noted.   Neurologic Exam  Mental status: The patient is  alert and oriented x 3 at the time of the examination. The patient has apparent normal recent and remote memory, with an apparently normal attention span and concentration ability.   Cranial nerves: Facial symmetry is present. Speech is normal, no aphasia or dysarthria is noted. Extraocular movements are full. Visual fields are full.  Motor: The patient has good strength in all 4 extremities.  Sensory examination: Soft touch sensation is symmetric on the face, arms, and legs.  Coordination: The patient has good finger-nose-finger and heel-to-shin bilaterally.  Gait and station: The patient has a normal gait. Tandem gait is unsteady. Romberg is negative. No drift is seen.  Reflexes: Deep tendon reflexes are symmetric.   Assessment/Plan:  1. Migraine headache  2. Chronic low back pain  The patient is to have low back surgery in the near future. She is doing relatively well with her headaches, she will continue the Topamax. A prescription was given for Ultram and for gabapentin. She will follow-up in 8 or 9 months.  Jill Alexanders MD 07/12/2014 6:32 PM  [facility] [address] [place], [place] [address]  Phone [phone] Fax [phone]

## 2014-07-12 NOTE — Progress Notes (Addendum)
Sees Frances Scales NP with [facility]  EKG in epic from 08-02-13  Denies ever having an Echo  Denies ever having a Stress test  Denies ever having a Heart cath  Denies CXR in past yr  Saw Dr.Hochrein in 2013 with visit in epic(son had died right before this appointment) was told no follow up needed

## 2014-07-12 NOTE — Patient Instructions (Signed)

## 2014-07-12 NOTE — Pre-Procedure Instructions (Signed)
Frances Robertson  07/12/2014   Your procedure is scheduled on:  Mon, April 11 @ 4:15 PM  Report to [facility]  at 1:15 PM.  Call this number if you have problems the morning of surgery: [phone]   Remember:   Do not eat food or drink liquids after midnight.   Take these medicines the morning of surgery with A SIP OF WATER: Atenolol(Tenormin),Valium(Diazepam),Gabapentin(Neurontin),Synthroid(Levothyroxine),Omeprazole(Prilosec),  and Tramadol(Ultram-if needed)               No Goody's,BC's,Aleve,Aspirin,Ibuprofen,Fish Oil,or any Herbal Medications.    Do not wear jewelry, make-up or nail polish.  Do not wear lotions, powders, or perfumes. You may wear deodorant.  Do not shave 48 hours prior to surgery.   Do not bring valuables to the hospital.  [facility] is not responsible                  for any belongings or valuables.               Contacts, dentures or bridgework may not be worn into surgery.  Leave suitcase in the car. After surgery it may be brought to your room.  For patients admitted to the hospital, discharge time is determined by your                treatment team.               Special Instructions:  [place] - Preparing for Surgery  Before surgery, you can play an important role.  Because skin is not sterile, your skin needs to be as free of germs as possible.  You can reduce the number of germs on you skin by washing with CHG (chlorahexidine gluconate) soap before surgery.  CHG is an antiseptic cleaner which kills germs and bonds with the skin to continue killing germs even after washing.  Please DO NOT use if you have an allergy to CHG or antibacterial soaps.  If your skin becomes reddened/irritated stop using the CHG and inform your nurse when you arrive at Short Stay.  Do not shave (including legs and underarms) for at least 48 hours prior to the first CHG shower.  You may shave your face.  Please follow these instructions carefully:   1.  Shower  with CHG Soap the night before surgery and the                                morning of Surgery.  2.  If you choose to wash your hair, wash your hair first as usual with your       normal shampoo.  3.  After you shampoo, rinse your hair and body thoroughly to remove the                      Shampoo.  4.  Use CHG as you would any other liquid soap.  You can apply chg directly       to the skin and wash gently with scrungie or a clean washcloth.  5.  Apply the CHG Soap to your body ONLY FROM THE NECK DOWN.        Do not use on open wounds or open sores.  Avoid contact with your eyes,       ears, mouth and genitals (private parts).  Wash genitals (private parts)       with your  normal soap.  6.  Wash thoroughly, paying special attention to the area where your surgery        will be performed.  7.  Thoroughly rinse your body with warm water from the neck down.  8.  DO NOT shower/wash with your normal soap after using and rinsing off       the CHG Soap.  9.  Pat yourself dry with a clean towel.            10.  Wear clean pajamas.            11.  Place clean sheets on your bed the night of your first shower and do not        sleep with pets.  Day of Surgery  Do not apply any lotions/deoderants the morning of surgery.  Please wear clean clothes to the hospital/surgery center.     Please read over the following fact sheets that you were given: Pain Booklet, Coughing and Deep Breathing, MRSA Information and Surgical Site Infection Prevention

## 2014-07-22 MED ORDER — CEFAZOLIN SODIUM-DEXTROSE 2-3 GM-% IV SOLR
2.0000 g | INTRAVENOUS | Status: AC
  Administered 2014-07-23: 2 g via INTRAVENOUS
  Filled 2014-07-22: qty 50

## 2014-07-23 ENCOUNTER — Ambulatory Visit (HOSPITAL_COMMUNITY): Payer: Medicare Other

## 2014-07-23 ENCOUNTER — Ambulatory Visit (HOSPITAL_COMMUNITY): Payer: Medicare Other | Admitting: Anesthesiology

## 2014-07-23 ENCOUNTER — Encounter (HOSPITAL_COMMUNITY): Payer: Self-pay | Admitting: Certified Registered Nurse Anesthetist

## 2014-07-23 ENCOUNTER — Ambulatory Visit (HOSPITAL_COMMUNITY)
Admission: RE | Admit: 2014-07-23 | Discharge: 2014-07-24 | Disposition: A | Discharge status: Home / Self Care | Payer: Medicare Other | Source: Ambulatory Visit | Attending: Neurosurgery | Admitting: Neurosurgery

## 2014-07-23 ENCOUNTER — Encounter (HOSPITAL_COMMUNITY)
Admission: RE | Disposition: A | Discharge status: Home / Self Care | Payer: Self-pay | Source: Ambulatory Visit | Attending: Neurosurgery

## 2014-07-23 DIAGNOSIS — E039 Hypothyroidism, unspecified: Secondary | ICD-10-CM | POA: Insufficient documentation

## 2014-07-23 DIAGNOSIS — Z888 Allergy status to other drugs, medicaments and biological substances status: Secondary | ICD-10-CM | POA: Diagnosis not present

## 2014-07-23 DIAGNOSIS — Z8701 Personal history of pneumonia (recurrent): Secondary | ICD-10-CM | POA: Diagnosis not present

## 2014-07-23 DIAGNOSIS — M5416 Radiculopathy, lumbar region: Secondary | ICD-10-CM | POA: Diagnosis not present

## 2014-07-23 DIAGNOSIS — Z9049 Acquired absence of other specified parts of digestive tract: Secondary | ICD-10-CM | POA: Insufficient documentation

## 2014-07-23 DIAGNOSIS — K219 Gastro-esophageal reflux disease without esophagitis: Secondary | ICD-10-CM | POA: Diagnosis not present

## 2014-07-23 DIAGNOSIS — Z419 Encounter for procedure for purposes other than remedying health state, unspecified: Secondary | ICD-10-CM

## 2014-07-23 DIAGNOSIS — M5126 Other intervertebral disc displacement, lumbar region: Secondary | ICD-10-CM | POA: Diagnosis present

## 2014-07-23 DIAGNOSIS — G4700 Insomnia, unspecified: Secondary | ICD-10-CM | POA: Diagnosis not present

## 2014-07-23 DIAGNOSIS — Z9851 Tubal ligation status: Secondary | ICD-10-CM | POA: Diagnosis not present

## 2014-07-23 DIAGNOSIS — Z9841 Cataract extraction status, right eye: Secondary | ICD-10-CM | POA: Diagnosis not present

## 2014-07-23 DIAGNOSIS — Z886 Allergy status to analgesic agent status: Secondary | ICD-10-CM | POA: Diagnosis not present

## 2014-07-23 DIAGNOSIS — Z9842 Cataract extraction status, left eye: Secondary | ICD-10-CM | POA: Insufficient documentation

## 2014-07-23 DIAGNOSIS — M79604 Pain in right leg: Secondary | ICD-10-CM | POA: Diagnosis present

## 2014-07-23 HISTORY — PX: LUMBAR LAMINECTOMY/DECOMPRESSION MICRODISCECTOMY: SHX5026

## 2014-07-23 SURGERY — LUMBAR LAMINECTOMY/DECOMPRESSION MICRODISCECTOMY 1 LEVEL
Anesthesia: General | Site: Back | Laterality: Right

## 2014-07-23 MED ORDER — HYDROMORPHONE HCL 1 MG/ML IJ SOLN
INTRAMUSCULAR | Status: AC
  Filled 2014-07-23: qty 1

## 2014-07-23 MED ORDER — DEXAMETHASONE SODIUM PHOSPHATE 10 MG/ML IJ SOLN
INTRAMUSCULAR | Status: AC
  Filled 2014-07-23: qty 1

## 2014-07-23 MED ORDER — KETOROLAC TROMETHAMINE 30 MG/ML IJ SOLN: 30.0000 mg | Freq: Once | INTRAMUSCULAR | Status: DC | PRN

## 2014-07-23 MED ORDER — POVIDONE-IODINE 10 % EX OINT
TOPICAL_OINTMENT | CUTANEOUS | Status: DC | PRN
  Administered 2014-07-23: 1 via TOPICAL

## 2014-07-23 MED ORDER — PANTOPRAZOLE SODIUM 40 MG PO TBEC
80.0000 mg | DELAYED_RELEASE_TABLET | Freq: Every day | ORAL | Status: DC
  Administered 2014-07-23: 80 mg via ORAL
  Filled 2014-07-23: qty 2

## 2014-07-23 MED ORDER — HYDROMORPHONE HCL 2 MG PO TABS: 2.0000 mg | ORAL_TABLET | ORAL | Status: DC | PRN

## 2014-07-23 MED ORDER — ATENOLOL 50 MG PO TABS
50.0000 mg | ORAL_TABLET | Freq: Every day | ORAL | Status: DC
  Filled 2014-07-23: qty 1

## 2014-07-23 MED ORDER — PROMETHAZINE HCL 25 MG/ML IJ SOLN: 6.2500 mg | INTRAMUSCULAR | Status: DC | PRN

## 2014-07-23 MED ORDER — ROCURONIUM BROMIDE 100 MG/10ML IV SOLN
INTRAVENOUS | Status: DC | PRN
  Administered 2014-07-23: 40 mg via INTRAVENOUS

## 2014-07-23 MED ORDER — LACTATED RINGERS IV SOLN: INTRAVENOUS | Status: DC

## 2014-07-23 MED ORDER — HEMOSTATIC AGENTS (NO CHARGE) OPTIME
TOPICAL | Status: DC | PRN
  Administered 2014-07-23: 1 via TOPICAL

## 2014-07-23 MED ORDER — ROCURONIUM BROMIDE 50 MG/5ML IV SOLN
INTRAVENOUS | Status: AC
  Filled 2014-07-23: qty 1

## 2014-07-23 MED ORDER — ONDANSETRON HCL 4 MG/2ML IJ SOLN: 4.0000 mg | INTRAMUSCULAR | Status: DC | PRN

## 2014-07-23 MED ORDER — ALUM & MAG HYDROXIDE-SIMETH 200-200-20 MG/5ML PO SUSP
30.0000 mL | Freq: Four times a day (QID) | ORAL | Status: DC | PRN
Start: 2014-07-23 — End: 2014-07-24

## 2014-07-23 MED ORDER — LEVOTHYROXINE SODIUM 100 MCG PO TABS
100.0000 ug | ORAL_TABLET | Freq: Every day | ORAL | Status: DC
  Administered 2014-07-24: 100 ug via ORAL
  Filled 2014-07-23 (×2): qty 1

## 2014-07-23 MED ORDER — GABAPENTIN 300 MG PO CAPS
300.0000 mg | ORAL_CAPSULE | Freq: Four times a day (QID) | ORAL | Status: DC
  Administered 2014-07-23: 300 mg via ORAL
  Filled 2014-07-23 (×5): qty 1

## 2014-07-23 MED ORDER — ZOLPIDEM TARTRATE 5 MG PO TABS
5.0000 mg | ORAL_TABLET | Freq: Every evening | ORAL | Status: DC | PRN
  Administered 2014-07-23: 5 mg via ORAL
  Filled 2014-07-23: qty 1

## 2014-07-23 MED ORDER — ONDANSETRON HCL 4 MG/2ML IJ SOLN
INTRAMUSCULAR | Status: DC | PRN
  Administered 2014-07-23: 4 mg via INTRAVENOUS

## 2014-07-23 MED ORDER — THROMBIN 5000 UNITS EX SOLR
CUTANEOUS | Status: DC | PRN
  Administered 2014-07-23 (×2): 5000 [IU] via TOPICAL

## 2014-07-23 MED ORDER — FLEET ENEMA 7-19 GM/118ML RE ENEM
1.0000 | ENEMA | Freq: Once | RECTAL | Status: AC | PRN
  Filled 2014-07-23: qty 1

## 2014-07-23 MED ORDER — ACETAMINOPHEN 325 MG PO TABS: 650.0000 mg | ORAL_TABLET | ORAL | Status: DC | PRN

## 2014-07-23 MED ORDER — LACTATED RINGERS IV SOLN
INTRAVENOUS | Status: DC | PRN
  Administered 2014-07-23 (×2): via INTRAVENOUS

## 2014-07-23 MED ORDER — LIDOCAINE HCL (CARDIAC) 20 MG/ML IV SOLN
INTRAVENOUS | Status: AC
  Filled 2014-07-23: qty 5

## 2014-07-23 MED ORDER — ONDANSETRON HCL 4 MG/2ML IJ SOLN
INTRAMUSCULAR | Status: AC
  Filled 2014-07-23: qty 2

## 2014-07-23 MED ORDER — FENTANYL CITRATE 0.05 MG/ML IJ SOLN
INTRAMUSCULAR | Status: DC | PRN
  Administered 2014-07-23: 150 ug via INTRAVENOUS
  Administered 2014-07-23 (×2): 25 ug via INTRAVENOUS
  Administered 2014-07-23: 50 ug via INTRAVENOUS

## 2014-07-23 MED ORDER — NEOSTIGMINE METHYLSULFATE 10 MG/10ML IV SOLN
INTRAVENOUS | Status: DC | PRN
  Administered 2014-07-23: 3 mg via INTRAVENOUS

## 2014-07-23 MED ORDER — CEFAZOLIN SODIUM-DEXTROSE 2-3 GM-% IV SOLR
2.0000 g | Freq: Three times a day (TID) | INTRAVENOUS | Status: DC
  Administered 2014-07-24: 2 g via INTRAVENOUS
  Filled 2014-07-23 (×2): qty 50

## 2014-07-23 MED ORDER — TOPIRAMATE 100 MG PO TABS
100.0000 mg | ORAL_TABLET | Freq: Every evening | ORAL | Status: DC
  Administered 2014-07-23: 100 mg via ORAL
  Filled 2014-07-23 (×2): qty 1

## 2014-07-23 MED ORDER — LACTATED RINGERS IV SOLN
INTRAVENOUS | Status: DC
  Administered 2014-07-23: 14:00:00 via INTRAVENOUS

## 2014-07-23 MED ORDER — MORPHINE SULFATE 2 MG/ML IJ SOLN: 1.0000 mg | INTRAMUSCULAR | Status: DC | PRN

## 2014-07-23 MED ORDER — PROPOFOL 10 MG/ML IV BOLUS
INTRAVENOUS | Status: DC | PRN
  Administered 2014-07-23: 150 mg via INTRAVENOUS

## 2014-07-23 MED ORDER — MIDAZOLAM HCL 2 MG/2ML IJ SOLN
INTRAMUSCULAR | Status: AC
  Filled 2014-07-23: qty 2

## 2014-07-23 MED ORDER — ARTIFICIAL TEARS OP OINT
TOPICAL_OINTMENT | OPHTHALMIC | Status: AC
  Filled 2014-07-23: qty 3.5

## 2014-07-23 MED ORDER — 0.9 % SODIUM CHLORIDE (POUR BTL) OPTIME
TOPICAL | Status: DC | PRN
  Administered 2014-07-23: 1000 mL

## 2014-07-23 MED ORDER — HYDROMORPHONE HCL 1 MG/ML IJ SOLN
0.2500 mg | INTRAMUSCULAR | Status: DC | PRN
  Administered 2014-07-23: 0.5 mg via INTRAVENOUS

## 2014-07-23 MED ORDER — HYDROMORPHONE HCL 2 MG PO TABS
2.0000 mg | ORAL_TABLET | ORAL | Status: DC | PRN
  Administered 2014-07-24: 2 mg via ORAL

## 2014-07-23 MED ORDER — MENTHOL 3 MG MT LOZG: 1.0000 | LOZENGE | OROMUCOSAL | Status: DC | PRN

## 2014-07-23 MED ORDER — SCOPOLAMINE 1 MG/3DAYS TD PT72
MEDICATED_PATCH | TRANSDERMAL | Status: DC | PRN
  Administered 2014-07-23: 1 via TRANSDERMAL

## 2014-07-23 MED ORDER — MIDAZOLAM HCL 5 MG/5ML IJ SOLN
INTRAMUSCULAR | Status: DC | PRN
  Administered 2014-07-23: 2 mg via INTRAVENOUS

## 2014-07-23 MED ORDER — NEOSTIGMINE METHYLSULFATE 10 MG/10ML IV SOLN
INTRAVENOUS | Status: AC
  Filled 2014-07-23: qty 1

## 2014-07-23 MED ORDER — DEXAMETHASONE SODIUM PHOSPHATE 10 MG/ML IJ SOLN
INTRAMUSCULAR | Status: DC | PRN
  Administered 2014-07-23: 10 mg via INTRAVENOUS

## 2014-07-23 MED ORDER — GLYCOPYRROLATE 0.2 MG/ML IJ SOLN
INTRAMUSCULAR | Status: AC
  Filled 2014-07-23: qty 2

## 2014-07-23 MED ORDER — BISACODYL 10 MG RE SUPP: 10.0000 mg | Freq: Every day | RECTAL | Status: DC | PRN

## 2014-07-23 MED ORDER — LIDOCAINE HCL (CARDIAC) 20 MG/ML IV SOLN
INTRAVENOUS | Status: DC | PRN
  Administered 2014-07-23: 50 mg via INTRAVENOUS

## 2014-07-23 MED ORDER — DIAZEPAM 5 MG PO TABS
5.0000 mg | ORAL_TABLET | Freq: Four times a day (QID) | ORAL | Status: DC | PRN
Start: 2014-07-23 — End: 2014-07-24
  Administered 2014-07-23 – 2014-07-24 (×2): 5 mg via ORAL
  Filled 2014-07-23 (×2): qty 1

## 2014-07-23 MED ORDER — PHENOL 1.4 % MT LIQD: 1.0000 | OROMUCOSAL | Status: DC | PRN

## 2014-07-23 MED ORDER — ACETAMINOPHEN 650 MG RE SUPP: 650.0000 mg | RECTAL | Status: DC | PRN

## 2014-07-23 MED ORDER — FENTANYL CITRATE 0.05 MG/ML IJ SOLN
INTRAMUSCULAR | Status: AC
  Filled 2014-07-23: qty 5

## 2014-07-23 MED ORDER — GLYCOPYRROLATE 0.2 MG/ML IJ SOLN
INTRAMUSCULAR | Status: DC | PRN
  Administered 2014-07-23: 0.4 mg via INTRAVENOUS

## 2014-07-23 MED ORDER — HYDROMORPHONE HCL 2 MG PO TABS
2.0000 mg | ORAL_TABLET | ORAL | Status: DC | PRN
  Filled 2014-07-23: qty 1

## 2014-07-23 MED ORDER — DOCUSATE SODIUM 100 MG PO CAPS
100.0000 mg | ORAL_CAPSULE | Freq: Two times a day (BID) | ORAL | Status: DC
  Administered 2014-07-23: 100 mg via ORAL
  Filled 2014-07-23: qty 1

## 2014-07-23 MED ORDER — BUPIVACAINE-EPINEPHRINE (PF) 0.5% -1:200000 IJ SOLN
INTRAMUSCULAR | Status: DC | PRN
  Administered 2014-07-23: 10 mL

## 2014-07-23 SURGICAL SUPPLY — 62 items
APL SKNCLS STERI-STRIP NONHPOA (GAUZE/BANDAGES/DRESSINGS) ×1
BAG DECANTER FOR FLEXI CONT (MISCELLANEOUS) ×3 IMPLANT
BENZOIN TINCTURE PRP APPL 2/3 (GAUZE/BANDAGES/DRESSINGS) ×3 IMPLANT
BLADE CLIPPER SURG (BLADE) IMPLANT
BRUSH SCRUB EZ PLAIN DRY (MISCELLANEOUS) ×3 IMPLANT
BUR MATCHSTICK NEURO 3.0 LAGG (BURR) ×3 IMPLANT
BUR PRECISION FLUTE 6.0 (BURR) ×3 IMPLANT
CANISTER SUCT 3000ML PPV (MISCELLANEOUS) ×3 IMPLANT
CLOSURE WOUND 1/2 X4 (GAUZE/BANDAGES/DRESSINGS) ×1
CONT SPEC 4OZ CLIKSEAL STRL BL (MISCELLANEOUS) ×3 IMPLANT
DRAPE LAPAROTOMY 100X72X124 (DRAPES) ×3 IMPLANT
DRAPE MICROSCOPE LEICA (MISCELLANEOUS) ×3 IMPLANT
DRAPE POUCH INSTRU U-SHP 10X18 (DRAPES) ×3 IMPLANT
DRAPE SURG 17X23 STRL (DRAPES) ×12 IMPLANT
ELECT BLADE 4.0 EZ CLEAN MEGAD (MISCELLANEOUS) ×3
ELECT REM PT RETURN 9FT ADLT (ELECTROSURGICAL) ×3
ELECTRODE BLDE 4.0 EZ CLN MEGD (MISCELLANEOUS) ×1 IMPLANT
ELECTRODE REM PT RTRN 9FT ADLT (ELECTROSURGICAL) ×1 IMPLANT
GAUZE SPONGE 4X4 12PLY STRL (GAUZE/BANDAGES/DRESSINGS) ×3 IMPLANT
GAUZE SPONGE 4X4 16PLY XRAY LF (GAUZE/BANDAGES/DRESSINGS) IMPLANT
GLOVE BIO SURGEON STRL SZ 6.5 (GLOVE) ×2 IMPLANT
GLOVE BIO SURGEON STRL SZ8 (GLOVE) ×3 IMPLANT
GLOVE BIO SURGEON STRL SZ8.5 (GLOVE) ×3 IMPLANT
GLOVE BIO SURGEONS STRL SZ 6.5 (GLOVE) ×2
GLOVE BIOGEL PI IND STRL 6.5 (GLOVE) IMPLANT
GLOVE BIOGEL PI IND STRL 7.5 (GLOVE) IMPLANT
GLOVE BIOGEL PI IND STRL 8.5 (GLOVE) IMPLANT
GLOVE BIOGEL PI INDICATOR 6.5 (GLOVE) ×4
GLOVE BIOGEL PI INDICATOR 7.5 (GLOVE) ×4
GLOVE BIOGEL PI INDICATOR 8.5 (GLOVE) ×2
GLOVE ECLIPSE 8.5 STRL (GLOVE) ×2 IMPLANT
GLOVE EXAM NITRILE LRG STRL (GLOVE) IMPLANT
GLOVE EXAM NITRILE MD LF STRL (GLOVE) IMPLANT
GLOVE EXAM NITRILE XL STR (GLOVE) IMPLANT
GLOVE EXAM NITRILE XS STR PU (GLOVE) IMPLANT
GLOVE SURG SS PI 7.0 STRL IVOR (GLOVE) ×2 IMPLANT
GOWN STRL REUS W/ TWL LRG LVL3 (GOWN DISPOSABLE) IMPLANT
GOWN STRL REUS W/ TWL XL LVL3 (GOWN DISPOSABLE) ×1 IMPLANT
GOWN STRL REUS W/TWL 2XL LVL3 (GOWN DISPOSABLE) ×2 IMPLANT
GOWN STRL REUS W/TWL LRG LVL3 (GOWN DISPOSABLE) ×3
GOWN STRL REUS W/TWL XL LVL3 (GOWN DISPOSABLE) ×6
KIT BASIN OR (CUSTOM PROCEDURE TRAY) ×3 IMPLANT
KIT ROOM TURNOVER OR (KITS) ×3 IMPLANT
NDL HYPO 21X1.5 SAFETY (NEEDLE) IMPLANT
NEEDLE HYPO 21X1.5 SAFETY (NEEDLE) IMPLANT
NEEDLE HYPO 22GX1.5 SAFETY (NEEDLE) ×3 IMPLANT
NS IRRIG 1000ML POUR BTL (IV SOLUTION) ×3 IMPLANT
PACK LAMINECTOMY NEURO (CUSTOM PROCEDURE TRAY) ×3 IMPLANT
PAD ARMBOARD 7.5X6 YLW CONV (MISCELLANEOUS) ×9 IMPLANT
PATTIES SURGICAL .5 X1 (DISPOSABLE) IMPLANT
RUBBERBAND STERILE (MISCELLANEOUS) ×6 IMPLANT
SPONGE SURGIFOAM ABS GEL SZ50 (HEMOSTASIS) ×3 IMPLANT
STRIP CLOSURE SKIN 1/2X4 (GAUZE/BANDAGES/DRESSINGS) ×2 IMPLANT
SUT VIC AB 1 CT1 18XBRD ANBCTR (SUTURE) ×1 IMPLANT
SUT VIC AB 1 CT1 8-18 (SUTURE) ×3
SUT VIC AB 2-0 CP2 18 (SUTURE) ×3 IMPLANT
SYR 20CC LL (SYRINGE) IMPLANT
SYR 20ML ECCENTRIC (SYRINGE) ×3 IMPLANT
TAPE CLOTH SURG 4X10 WHT LF (GAUZE/BANDAGES/DRESSINGS) ×2 IMPLANT
TOWEL OR 17X24 6PK STRL BLUE (TOWEL DISPOSABLE) ×3 IMPLANT
TOWEL OR 17X26 10 PK STRL BLUE (TOWEL DISPOSABLE) ×3 IMPLANT
WATER STERILE IRR 1000ML POUR (IV SOLUTION) ×3 IMPLANT

## 2014-07-23 NOTE — Transfer of Care (Signed)
Immediate Anesthesia Transfer of Care Note  Patient: Frances Robertson  Procedure(s) Performed: Procedure(s) with comments: Right Lumbar four-five microdiskectomy (Right) - Right Lumbar four-five microdiskectomy  Patient Location: PACU  Anesthesia Type:General  Level of Consciousness: awake, alert , oriented, patient cooperative and responds to stimulation  Airway & Oxygen Therapy: Patient Spontanous Breathing and Patient connected to nasal cannula oxygen  Post-op Assessment: Report given to RN and Post -op Vital signs reviewed and stable  Post vital signs: Reviewed and stable  Last Vitals:  Filed Vitals:   07/23/14 1301  BP: 187/55  Pulse: 61  Temp: 36.3 C  Resp: 20    Complications: No apparent anesthesia complications

## 2014-07-23 NOTE — Progress Notes (Signed)
Patient ID: Frances Robertson, female   DOB: [birth year], 74 y.o.   MRN: [id number] Subjective:  The patient is alert and pleasant and in no apparent distress. She complains of some periumbilical pain.  Objective: Vital signs in last 24 hours: Temp:  [97.3 F (36.3 C)-98 F (36.7 C)] 98 F (36.7 C) (04/11 1707) Pulse Rate:  [61-78] 78 (04/11 1707) Resp:  [12-20] 14 (04/11 1730) BP: (153-187)/(55-74) 153/74 mmHg (04/11 1730) SpO2:  [100 %] 100 % (04/11 1730) Weight:  [60.357 kg (133 lb 1 oz)] 60.357 kg (133 lb 1 oz) (04/11 1301)  Intake/Output from previous day:   Intake/Output this shift: Total I/O In: 1200 [I.V.:1200] Out: -   Physical exam the patient is alert and pleasant. She is moving her lower extremities well. Her abdomen is soft, nondistended, and nontender.  Lab Results: No results for input(s): WBC, HGB, HCT, PLT in the last 72 hours. BMET No results for input(s): NA, K, CL, CO2, GLUCOSE, BUN, CREATININE, CALCIUM in the last 72 hours.  Studies/Results: No results found.  Assessment/Plan: The patient is doing well. Perhaps her periumbilical pain was secondary to positioning. We will continue observation for now.      Yasin Ducat D 07/23/2014, 5:41 PM

## 2014-07-23 NOTE — Plan of Care (Signed)
Problem: Consults Goal: Diagnosis - Spinal Surgery Outcome: Completed/Met Date Met:  07/23/14 Microdiscectomy

## 2014-07-23 NOTE — Anesthesia Procedure Notes (Signed)
Performed by: Verdie Drown Pre-anesthesia Checklist: Patient identified, Emergency Drugs available, Suction available, Patient being monitored and Timeout performed Patient Re-evaluated:Patient Re-evaluated prior to inductionOxygen Delivery Method: Circle system utilized Preoxygenation: Pre-oxygenation with 100% oxygen Intubation Type: IV induction Ventilation: Mask ventilation without difficulty Laryngoscope Size: Mac and 3 Grade View: Grade I Tube type: Oral Tube size: 7.5 mm Number of attempts: 1

## 2014-07-23 NOTE — H&P (Signed)
Subjective: The patient is a 74 year old white female who has complained of back, right buttock and right leg pain. She has failed medical management and was worked up with a lumbar MRI. This demonstrated a herniated disc at L4-5 on the right. I discussed the various treatment options including surgery. The patient has decided to proceed with a right L4-5 discectomy.   Past Medical History  Diagnosis Date  . Anemia, unspecified   . Low back pain     herniated disc  . Unspecified hypothyroidism     takes Synthroid daily  . GERD (gastroesophageal reflux disease)     takes Omeprazole daily  . Insomnia     takes Ambien nightly  . PONV (postoperative nausea and vomiting)   . Family history of adverse reaction to anesthesia     brother gets very sick  . PAC (premature atrial contraction)   . Pneumonia hx of-as a child  . Bronchitis 5-61yrs ago  . Tinnitus   . Headache(784.0)     takes Topamax daily and Atenolol daily;takes Tramadol daily as well  . Weakness     numbness related to back   . Arthritis   . Joint pain   . Joint swelling     Past Surgical History  Procedure Laterality Date  . Carpal tunnel release Bilateral   . Cholecystectomy    . Cataract extraction Bilateral   . Tubal ligation Bilateral   . Esophagogastroduodenoscopy      Allergies  Allergen Reactions  . Celandine [Chelidonium Majus] Nausea And Vomiting  . Celebrex [Celecoxib] Nausea And Vomiting  . Hydrocodone Other (See Comments)    sick  . Neosporin [Neomycin-Bacitracin Zn-Polymyx] Rash  . Other Rash and Other (See Comments)    Icy Hot Patches  . Oxycodone Nausea And Vomiting    History  Substance Use Topics  . Smoking status: Never Smoker   . Smokeless tobacco: Never Used  . Alcohol Use: No    Family History  Problem Relation Age of Onset  . Venous thrombosis Mother 47  . Stroke Father 53    Sisters x 2 with breast cancer, brothers x 2 died with lung cancer  . Cancer Other   . Sudden death Son  3  . Cancer - Lung Brother   . Cancer - Lung Brother    Prior to Admission medications   Medication Sig Start Date End Date Taking? Authorizing Provider  atenolol (TENORMIN) 50 MG tablet Take 50 mg by mouth daily.   Yes Historical Provider, MD  gabapentin (NEURONTIN) 300 MG capsule Take 1 capsule (300 mg total) by mouth 4 (four) times daily. Patient taking differently: Take 1,200 mg by mouth daily.  07/12/14  Yes Kathrynn Ducking, MD  levothyroxine (SYNTHROID, LEVOTHROID) 100 MCG tablet Take 100 mcg by mouth daily before breakfast.   Yes Historical Provider, MD  omeprazole (PRILOSEC) 40 MG capsule Take 40 mg by mouth daily.   Yes Historical Provider, MD  topiramate (TOPAMAX) 100 MG tablet Take 1 tablet (100 mg total) by mouth every evening. 11/07/13  Yes Kathrynn Ducking, MD  traMADol (ULTRAM) 50 MG tablet TAKE 1 TABLET BY MOUTH EVERY 4 HOURS AS NEEDED Patient taking differently: Take 50 mg by mouth every 6 (six) hours as needed for moderate pain.  07/12/14  Yes Kathrynn Ducking, MD  zolpidem (AMBIEN) 10 MG tablet Take 10 mg by mouth at bedtime.    Yes Historical Provider, MD     Review of Systems  Positive ROS:  As above  All other systems have been reviewed and were otherwise negative with the exception of those mentioned in the HPI and as above.  Objective: Vital signs in last 24 hours: Temp:  [97.3 F (36.3 C)] 97.3 F (36.3 C) (04/11 1301) Pulse Rate:  [61] 61 (04/11 1301) Resp:  [20] 20 (04/11 1301) BP: (187)/(55) 187/55 mmHg (04/11 1301) SpO2:  [100 %] 100 % (04/11 1301) Weight:  [60.357 kg (133 lb 1 oz)] 60.357 kg (133 lb 1 oz) (04/11 1301)  General Appearance: Alert, cooperative, no distress, Head: Normocephalic, without obvious abnormality, atraumatic Eyes: PERRL, conjunctiva/corneas clear, EOM's intact,    Ears: Normal  Throat: Normal  Neck: Supple, symmetrical, trachea midline, no adenopathy; thyroid: No enlargement/tenderness/nodules; no carotid bruit or JVD Back:  Symmetric, no curvature, ROM normal, no CVA tenderness Lungs: Clear to auscultation bilaterally, respirations unlabored Heart: Regular rate and rhythm, no murmur, rub or gallop Abdomen: Soft, non-tender,, no masses, no organomegaly Extremities: Extremities normal, atraumatic, no cyanosis or edema Pulses: 2+ and symmetric all extremities Skin: Skin color, texture, turgor normal, no rashes or lesions  NEUROLOGIC:   Mental status: alert and oriented, no aphasia, good attention span, Fund of knowledge/ memory ok Motor Exam - grossly normal Sensory Exam - grossly normal Reflexes:  Coordination - grossly normal Gait - grossly normal Balance - grossly normal Cranial Nerves: I: smell Not tested  II: visual acuity  OS: Normal  OD: Normal   II: visual fields Full to confrontation  II: pupils Equal, round, reactive to light  III,VII: ptosis None  III,IV,VI: extraocular muscles  Full ROM  V: mastication Normal  V: facial light touch sensation  Normal  V,VII: corneal reflex  Present  VII: facial muscle function - upper  Normal  VII: facial muscle function - lower Normal  VIII: hearing Not tested  IX: soft palate elevation  Normal  IX,X: gag reflex Present  XI: trapezius strength  5/5  XI: sternocleidomastoid strength 5/5  XI: neck flexion strength  5/5  XII: tongue strength  Normal    Data Review Lab Results  Component Value Date   WBC 4.8 07/12/2014   HGB 11.0* 07/12/2014   HCT 34.4* 07/12/2014   MCV 88.7 07/12/2014   PLT 168 07/12/2014   Lab Results  Component Value Date   NA 140 07/12/2014   K 4.5 07/12/2014   CL 107 07/12/2014   CO2 25 07/12/2014   BUN 16 07/12/2014   CREATININE 0.70 07/12/2014   GLUCOSE 105* 07/12/2014   No results found for: INR, PROTIME  Assessment/Plan: 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 I have reviewed her imaging studies with her and pointed out the abnormalities. We have  discussed the various treatment options including surgery. I have described the surgical treatment option of a right L4-5 discectomy. I have shown her surgical models. We have discussed the risks, benefits, alternatives, and likelihood of achieving our goals with surgery. I have answered all the patient's questions. She has decided to proceed with a right L4-5 discectomy.   Frances Robertson D 07/23/2014 3:30 PM

## 2014-07-23 NOTE — Anesthesia Preprocedure Evaluation (Signed)
Anesthesia Evaluation  Patient identified by MRN, date of birth, ID band Patient awake    Reviewed: Allergy & Precautions, NPO status , Patient's Chart, lab work & pertinent test results  History of Anesthesia Complications (+) PONV  Airway Mallampati: II  TM Distance: >3 FB Neck ROM: Full    Dental no notable dental hx.    Pulmonary neg pulmonary ROS,  breath sounds clear to auscultation  Pulmonary exam normal       Cardiovascular negative cardio ROS  Rhythm:Regular Rate:Normal     Neuro/Psych negative neurological ROS  negative psych ROS   GI/Hepatic negative GI ROS, Neg liver ROS,   Endo/Other  Hypothyroidism   Renal/GU negative Renal ROS  negative genitourinary   Musculoskeletal negative musculoskeletal ROS (+)   Abdominal   Peds negative pediatric ROS (+)  Hematology   Anesthesia Other Findings   Reproductive/Obstetrics negative OB ROS                             Anesthesia Physical Anesthesia Plan  ASA: II  Anesthesia Plan: General   Post-op Pain Management:    Induction: Intravenous  Airway Management Planned: Oral ETT  Additional Equipment:   Intra-op Plan:   Post-operative Plan: Extubation in OR  Informed Consent: I have reviewed the patients History and Physical, chart, labs and discussed the procedure including the risks, benefits and alternatives for the proposed anesthesia with the patient or authorized representative who has indicated his/her understanding and acceptance.   Dental advisory given  Plan Discussed with: CRNA and Surgeon  Anesthesia Plan Comments:         Anesthesia Quick Evaluation

## 2014-07-23 NOTE — Progress Notes (Signed)
States she had pancreatitis after a ERCP and was very sick afterward. "was in the hospital 7 days after they stayed in my common bile duct too long. "

## 2014-07-23 NOTE — Op Note (Signed)
Brief history: The patient is a 74 year old white female who has complained of back, right buttock and leg pain consistent with a lumbar radiculopathy. She has failed medical management and was worked up with a lumbar MRI. This demonstrated a herniated disc at L4-5 on the right. I discussed the various treatment options with the patient. She has decided to proceed with a right L4-5 discectomy after weighing the risks, benefits, and alternative to surgery.  Preoperative diagnosis: Right L4-5 disc, lumbago, lumbar radiculopathy  Postoperative diagnosis: The same  Procedure: Right L4-5 Intervertebral discectomy using micro-dissection  Surgeon: Dr. Earle Gell  Asst.: Dr. Kristeen Miss  Anesthesia: Gen. endotracheal  Estimated blood loss: Minimal  Drains: None  Complications: None  Description of procedure: The patient was brought to the operating room by the anesthesia team. General endotracheal anesthesia was induced. The patient was turned to the prone position on the Wilson frame. The patient's lumbosacral region was then prepared with Betadine scrub and Betadine solution. Sterile drapes were applied.  I then injected the area to be incised with Marcaine with epinephrine solution. I then used a scalpel to make a linear midline incision over the  L4-5 intervertebral disc space. I then used electrocautery to perform a right sided subperiosteal dissection exposing the spinous process and lamina of L4 and L5. We obtained intraoperative radiograph to confirm our location. I then inserted the [facility] retractor for exposure.  We then brought the operative microscope into the field. Under its magnification and illumination we completed the microdissection. I used a high-speed drill to perform a laminotomy at L4 on the right. I then used a Kerrison punches to widen the laminotomy and removed the ligamentum flavum at L4-5 on the right. We then used microdissection to free up the thecal sac and the  L5 nerve root from the epidural tissue. I then used a Kerrison punch to perform a foraminotomy at about the right L5 nerve root. We then using the nerve root retractor to gently retract the thecal sac and the right L5 nerve root medially. This exposed the intervertebral disc. We identified the ruptured disc and remove it with the pituitary forceps. We did not perform an intervertebral discectomy.  I then palpated along the ventral surface of the thecal sac and along exit route of the right L5 nerve root and noted that the neural structures were well decompressed. This completed the decompression.  We then obtained hemostasis using bipolar electrocautery. We irrigated the wound out with bacitracin solution. We then removed the retractor. We then reapproximated the patient's thoracolumbar fascia with interrupted #1 Vicryl suture. We then reapproximated the patient's subcutaneous tissue with interrupted 2-0 Vicryl suture. We then reapproximated patient's skin with Steri-Strips and benzoin. The was then coated with bacitracin ointment. The drapes were removed. The patient was subsequently returned to the supine position where they were extubated by the anesthesia team. The patient was then transported to the postanesthesia care unit in stable condition. All sponge instrument and needle counts were reportedly correct at the end of this case.

## 2014-07-23 NOTE — Anesthesia Postprocedure Evaluation (Signed)
Anesthesia Post Note  Patient: Frances Robertson  Procedure(s) Performed: Procedure(s) (LRB): Right Lumbar four-five microdiskectomy (Right)  Anesthesia type: general  Patient location: PACU  Post pain: Pain level controlled  Post assessment: Patient's Cardiovascular Status Stable  Last Vitals:  Filed Vitals:   07/23/14 1730  BP: 153/74  Pulse:   Temp:   Resp: 14    Post vital signs: Reviewed and stable  Level of consciousness: sedated  Complications: No apparent anesthesia complications

## 2014-07-23 NOTE — Progress Notes (Signed)
Pt c/o abd pain around umbilicus after surgery. 6/10  abd soft to touch and palpation. No redness or visible irregularities. Dr. Arnoldo Morale at bedside. Checked Abdomen. No reason found for pain unless being prone during surgery may have  Caused this. Will monitor

## 2014-07-24 ENCOUNTER — Encounter (HOSPITAL_COMMUNITY): Payer: Self-pay | Admitting: Neurosurgery

## 2014-07-24 DIAGNOSIS — M5416 Radiculopathy, lumbar region: Secondary | ICD-10-CM | POA: Diagnosis not present

## 2014-07-24 MED ORDER — DIAZEPAM 5 MG PO TABS: 5.0000 mg | ORAL_TABLET | Freq: Four times a day (QID) | ORAL | Status: DC | PRN

## 2014-07-24 MED ORDER — DOCUSATE SODIUM 100 MG PO CAPS
100.0000 mg | ORAL_CAPSULE | Freq: Two times a day (BID) | ORAL | Status: DC
Start: 2014-07-24 — End: 2015-02-28

## 2014-07-24 MED ORDER — HYDROMORPHONE HCL 2 MG PO TABS: 2.0000 mg | ORAL_TABLET | ORAL | Status: DC | PRN

## 2014-07-24 NOTE — Progress Notes (Signed)
Patient alert and oriented, mae's well, voiding adequate amount of urine, swallowing without difficulty, no c/o pain. Patient discharged home with family. Script and discharged instructions given to patient. Patient and family stated understanding of d/c instructions given and has an appointment with MD. 

## 2014-07-24 NOTE — Discharge Summary (Signed)
Physician Discharge Summary  Patient ID: Frances Robertson MRN: [id number] DOB/AGE: 75-May-1942 75 y.o.  Admit date: 07/23/2014 Discharge date: 07/24/2014  Admission Diagnoses: Right L4-5 herniated disc, lumbago, lumbar radiculopathy  Discharge Diagnoses: The same Active Problems:   Lumbar herniated disc   Discharged Condition: good  Hospital Course: I performed a right L4-5 discectomy on the patient on 07/23/2014. The surgery went well.  The patient's postoperative course is unremarkable. On postoperative day #1 she requested discharge to home. She was given oral and written discharge instructions. All her questions were answered.  Consults: None Significant Diagnostic Studies: None Treatments: Right L4-5 discectomy using microdissection. Discharge Exam: Blood pressure 113/67, pulse 62, temperature 98.5 F (36.9 C), temperature source Oral, resp. rate 18, height 5\' 3"  (1.6 m), weight 60.357 kg (133 lb 1 oz), SpO2 100 %. The patient is alert and pleasant. Her dressing is clean and dry. Her lower extremity strength is normal. She looks well.  Disposition: Home  Discharge Instructions    Call MD for:  difficulty breathing, headache or visual disturbances    Complete by:  As directed      Call MD for:  extreme fatigue    Complete by:  As directed      Call MD for:  hives    Complete by:  As directed      Call MD for:  persistant dizziness or light-headedness    Complete by:  As directed      Call MD for:  persistant nausea and vomiting    Complete by:  As directed      Call MD for:  redness, tenderness, or signs of infection (pain, swelling, redness, odor or green/yellow discharge around incision site)    Complete by:  As directed      Call MD for:  severe uncontrolled pain    Complete by:  As directed      Call MD for:  temperature >100.4    Complete by:  As directed      Diet - low sodium heart healthy    Complete by:  As directed      Discharge instructions    Complete  by:  As directed   Call [phone] for a followup appointment. Take a stool softener while you are using pain medications.     Driving Restrictions    Complete by:  As directed   Do not drive for 2 weeks.     Increase activity slowly    Complete by:  As directed      Lifting restrictions    Complete by:  As directed   Do not lift more than 5 pounds. No excessive bending or twisting.     May shower / Bathe    Complete by:  As directed   He may shower after the pain she is removed 3 days after surgery. Leave the incision alone.     Remove dressing in 48 hours    Complete by:  As directed   Your stitches are under the scan and will dissolve by themselves. The Steri-Strips will fall off after you take a few showers. Do not rub back or pick at the wound, Leave the wound alone.            Medication List    STOP taking these medications        traMADol 50 MG tablet  Commonly known as:  ULTRAM      TAKE these medications  atenolol 50 MG tablet  Commonly known as:  TENORMIN  Take 50 mg by mouth daily.     diazepam 5 MG tablet  Commonly known as:  VALIUM  Take 1 tablet (5 mg total) by mouth every 6 (six) hours as needed for muscle spasms.     docusate sodium 100 MG capsule  Commonly known as:  COLACE  Take 1 capsule (100 mg total) by mouth 2 (two) times daily.     gabapentin 300 MG capsule  Commonly known as:  NEURONTIN  Take 1 capsule (300 mg total) by mouth 4 (four) times daily.     HYDROmorphone 2 MG tablet  Commonly known as:  DILAUDID  Take 1 tablet (2 mg total) by mouth every 4 (four) hours as needed for severe pain.     HYDROmorphone 2 MG tablet  Commonly known as:  DILAUDID  Take 1 tablet (2 mg total) by mouth every 4 (four) hours as needed for severe pain.     levothyroxine 100 MCG tablet  Commonly known as:  SYNTHROID, LEVOTHROID  Take 100 mcg by mouth daily before breakfast.     omeprazole 40 MG capsule  Commonly known as:  PRILOSEC  Take 40 mg  by mouth daily.     topiramate 100 MG tablet  Commonly known as:  TOPAMAX  Take 1 tablet (100 mg total) by mouth every evening.     zolpidem 10 MG tablet  Commonly known as:  AMBIEN  Take 10 mg by mouth at bedtime.           Follow-up Information    Follow up with Frances Charter, MD.   Specialty:  Neurosurgery   Contact information:   [address] [place] [address] [phone]       Signed: Ophelia Robertson 07/24/2014, 7:51 AM

## 2014-11-05 ENCOUNTER — Telehealth: Payer: Self-pay | Admitting: Neurology

## 2014-11-05 MED ORDER — TOPIRAMATE 100 MG PO TABS: 100.0000 mg | ORAL_TABLET | Freq: Every evening | ORAL | Status: DC

## 2014-11-05 NOTE — Telephone Encounter (Signed)
Pt needs refill on topiramate (TOPAMAX) 100 MG tablet. [facility] in [place]

## 2014-11-05 NOTE — Telephone Encounter (Signed)
Rx has been sent  

## 2015-01-22 ENCOUNTER — Other Ambulatory Visit: Payer: Self-pay | Admitting: Neurology

## 2015-01-22 MED ORDER — GABAPENTIN 300 MG PO CAPS: 300.0000 mg | ORAL_CAPSULE | Freq: Four times a day (QID) | ORAL | Status: DC

## 2015-01-22 MED ORDER — TRAMADOL HCL 50 MG PO TABS: 50.0000 mg | ORAL_TABLET | ORAL | Status: DC | PRN

## 2015-01-22 NOTE — Telephone Encounter (Signed)
Patient called to request refills on gabapentin (NEURONTIN) 300 MG capsule and Tramadol

## 2015-01-22 NOTE — Telephone Encounter (Signed)
Requests entered, forwarded to provider for review.  Patient has appt next month.

## 2015-01-23 NOTE — Telephone Encounter (Signed)
Rx signed and faxed.

## 2015-02-01 ENCOUNTER — Other Ambulatory Visit: Payer: Self-pay | Admitting: Neurology

## 2015-02-26 ENCOUNTER — Ambulatory Visit: Payer: Medicare Other | Admitting: Neurology

## 2015-02-26 ENCOUNTER — Telehealth: Payer: Self-pay

## 2015-02-26 NOTE — Telephone Encounter (Signed)
Patient did not come for a follow up appointment today.

## 2015-02-27 ENCOUNTER — Encounter: Payer: Self-pay | Admitting: Neurology

## 2015-02-28 ENCOUNTER — Encounter (INDEPENDENT_AMBULATORY_CARE_PROVIDER_SITE_OTHER): Payer: Self-pay

## 2015-02-28 ENCOUNTER — Telehealth: Payer: Self-pay | Admitting: Neurology

## 2015-02-28 ENCOUNTER — Ambulatory Visit (INDEPENDENT_AMBULATORY_CARE_PROVIDER_SITE_OTHER): Payer: Medicare Other | Admitting: Nurse Practitioner

## 2015-02-28 ENCOUNTER — Encounter: Payer: Self-pay | Admitting: Nurse Practitioner

## 2015-02-28 VITALS — BP 143/71 | HR 71 | Ht 62.0 in | Wt 138.6 lb

## 2015-02-28 DIAGNOSIS — G43019 Migraine without aura, intractable, without status migrainosus: Secondary | ICD-10-CM

## 2015-02-28 NOTE — Progress Notes (Signed)
[facility]  PATIENT: Frances Robertson DOB: 06/02/1940   REASON FOR VISIT: Follow-up for migraines , history of low back pain HISTORY FROM: Patient    HISTORY OF PRESENT ILLNESS:Ms. Rains is a 74 year old right-handed white female with a history of migraine headaches. The patient indicates that her headaches are doing relatively well, she is having 1 or 2 headaches a month that are severe. The patient is able to control the headaches within several hours. She had right lumbar micro-diskectomy in April of this year and she no longer has significant issues with back pain.  She indicates that her headaches are bifrontal in nature, and they usually last 2 or 3 hours when they do occur. The patient is on gabapentin for the back and for the headache, and she takes Topamax 100 mg at night. Patient also reports a sinus headache today due to allergies. She self medicates for this. She denies side effects to her medications. She returns for reevaluation   REVIEW OF SYSTEMS: Full 14 system review of systems performed and notable only for those listed, all others are neg:  Constitutional: neg  Cardiovascular: neg Ear/Nose/Throat: neg  Skin: neg Eyes: neg Respiratory: neg Gastroitestinal: neg  Hematology/Lymphatic: neg  Endocrine: neg Musculoskeletal:neg Allergy/Immunology: neg Neurological: Headache Psychiatric: neg Sleep : Insomnia ALLERGIES: Allergies  Allergen Reactions  . Celandine [Chelidonium Majus] Nausea And Vomiting  . Celebrex [Celecoxib] Nausea And Vomiting  . Hydrocodone Other (See Comments)    sick  . Neosporin [Neomycin-Bacitracin Zn-Polymyx] Rash  . Other Rash and Other (See Comments)    Icy Hot Patches  . Oxycodone Nausea And Vomiting    HOME MEDICATIONS: Outpatient Prescriptions Prior to Visit  Medication Sig Dispense Refill  . atenolol (TENORMIN) 50 MG tablet Take 50 mg by mouth daily.    Marland Kitchen gabapentin (NEURONTIN) 300 MG capsule Take 1 capsule  (300 mg total) by mouth 4 (four) times daily. 360 capsule 0  . levothyroxine (SYNTHROID, LEVOTHROID) 100 MCG tablet Take 100 mcg by mouth daily before breakfast.    . omeprazole (PRILOSEC) 40 MG capsule Take 40 mg by mouth daily.    Marland Kitchen topiramate (TOPAMAX) 100 MG tablet TAKE 1 TABLET (100 MG TOTAL) BY MOUTH EVERY EVENING. 90 tablet 0  . traMADol (ULTRAM) 50 MG tablet Take 1 tablet (50 mg total) by mouth every 4 (four) hours as needed. (Patient taking differently: Take 100 mg by mouth daily. ) 180 tablet 0  . zolpidem (AMBIEN) 10 MG tablet Take 10 mg by mouth at bedtime.     . diazepam (VALIUM) 5 MG tablet Take 1 tablet (5 mg total) by mouth every 6 (six) hours as needed for muscle spasms. 50 tablet 1  . docusate sodium (COLACE) 100 MG capsule Take 1 capsule (100 mg total) by mouth 2 (two) times daily. (Patient not taking: Reported on 02/28/2015) 60 capsule 0  . HYDROmorphone (DILAUDID) 2 MG tablet Take 1 tablet (2 mg total) by mouth every 4 (four) hours as needed for severe pain. 100 tablet 0  . HYDROmorphone (DILAUDID) 2 MG tablet Take 1 tablet (2 mg total) by mouth every 4 (four) hours as needed for severe pain. 100 tablet 0   No facility-administered medications prior to visit.    PAST MEDICAL HISTORY: Past Medical History  Diagnosis Date  . Anemia, unspecified   . Low back pain     herniated disc  . Unspecified hypothyroidism     takes Synthroid daily  . GERD (gastroesophageal reflux disease)  takes Omeprazole daily  . Insomnia     takes Ambien nightly  . PONV (postoperative nausea and vomiting)   . Family history of adverse reaction to anesthesia     brother gets very sick  . PAC (premature atrial contraction)   . Pneumonia hx of-as a child  . Bronchitis 5-50yrs ago  . Tinnitus   . Headache(784.0)     takes Topamax daily and Atenolol daily;takes Tramadol daily as well  . Weakness     numbness related to back   . Arthritis   . Joint pain   . Joint swelling     PAST  SURGICAL HISTORY: Past Surgical History  Procedure Laterality Date  . Carpal tunnel release Bilateral   . Cholecystectomy    . Cataract extraction Bilateral   . Tubal ligation Bilateral   . Esophagogastroduodenoscopy    . Lumbar laminectomy/decompression microdiscectomy Right 07/23/2014    Procedure: Right Lumbar four-five microdiskectomy;  Surgeon: Newman Pies, MD;  Location: [facility];  Service: Neurosurgery;  Laterality: Right;  Right Lumbar four-five microdiskectomy    FAMILY HISTORY: Family History  Problem Relation Age of Onset  . Venous thrombosis Mother 86  . Stroke Father 70    Sisters x 2 with breast cancer, brothers x 2 died with lung cancer  . Cancer Other   . Sudden death Son 68  . Cancer - Lung Brother   . Cancer - Lung Brother     SOCIAL HISTORY: Social History   Social History  . Marital Status: Widowed    Spouse Name: N/A  . Number of Children: 2  . Years of Education: 9   Occupational History  . Caregiver    Social History Main Topics  . Smoking status: Never Smoker   . Smokeless tobacco: Never Used  . Alcohol Use: No  . Drug Use: No  . Sexual Activity: Not on file   Other Topics Concern  . Not on file   Social History Narrative   Lives alone.    Patient is right handed.   Patient does not drink caffeine.     PHYSICAL EXAM  Filed Vitals:   02/28/15 0745  BP: 143/71  Pulse: 71  Height: 5\' 2"  (1.575 m)  Weight: 138 lb 9.6 oz (62.869 kg)   Body mass index is 25.34 kg/(m^2). General: The patient is alert and cooperative at the time of the examination. Skin: No significant peripheral edema is noted. Neurologic Exam Mental status: The patient is alert and oriented x 3 at the time of the examination. The patient has apparent normal recent and remote memory, with an apparently normal attention span and concentration ability. Cranial nerves: Facial symmetry is present. Speech is normal, no aphasia or dysarthria is noted. Extraocular  movements are full. Visual fields are full. Motor: The patient has good strength in all 4 extremities. Sensory examination: Soft touch sensation is symmetric on the face, arms, and legs. Coordination: The patient has good finger-nose-finger and heel-to-shin bilaterally. Gait and station: The patient has a normal gait. Tandem gait is mildly unsteady. Romberg is negative. No drift is seen. Reflexes: Deep tendon reflexes are symmetric. DIAGNOSTIC DATA (LABS, IMAGING, TESTING) - I reviewed patient records, labs, notes, testing and imaging myself where available.  Lab Results  Component Value Date   WBC 4.8 07/12/2014   HGB 11.0* 07/12/2014   HCT 34.4* 07/12/2014   MCV 88.7 07/12/2014   PLT 168 07/12/2014      Component Value Date/Time   NA  140 07/12/2014 1126   K 4.5 07/12/2014 1126   CL 107 07/12/2014 1126   CO2 25 07/12/2014 1126   GLUCOSE 105* 07/12/2014 1126   BUN 16 07/12/2014 1126   CREATININE 0.70 07/12/2014 1126   CALCIUM 9.5 07/12/2014 1126   GFRNONAA 83* 07/12/2014 1126   GFRAA >90 07/12/2014 1126    ASSESSMENT AND PLAN  74 y.o. year old female  has a past medical history of  Headache(784.0); and migraine here to follow-up.  Continue Topamax at current dose Continue tramadol when necessary only Patient is getting ready to change pharmacies will let us know where to send her refills F/U yearly and prn Dennie Bible, [facility], [facility], APRN  [facility] [address] [place], [place] [address] [phone]

## 2015-02-28 NOTE — Telephone Encounter (Signed)
Error

## 2015-02-28 NOTE — Progress Notes (Signed)
I have read the note, and I agree with the clinical assessment and plan.  WILLIS,CHARLES KEITH   

## 2015-02-28 NOTE — Patient Instructions (Addendum)
Continue Topamax at current dose F/U yearly and prn

## 2015-04-16 ENCOUNTER — Telehealth: Payer: Self-pay | Admitting: Neurology

## 2015-04-16 MED ORDER — GABAPENTIN 300 MG PO CAPS: 300.0000 mg | ORAL_CAPSULE | Freq: Four times a day (QID) | ORAL | Status: DC

## 2015-04-16 NOTE — Telephone Encounter (Signed)
Pt needs refill on gabapentin (NEURONTIN) 300 MG capsule. Please call rx into [facility] off of [address] , [place] phone: [phone]

## 2015-04-19 ENCOUNTER — Other Ambulatory Visit: Payer: Self-pay | Admitting: Neurology

## 2015-04-29 ENCOUNTER — Telehealth: Payer: Self-pay | Admitting: Neurology

## 2015-04-29 MED ORDER — TOPIRAMATE 100 MG PO TABS: ORAL_TABLET | ORAL | Status: DC

## 2015-04-29 NOTE — Telephone Encounter (Signed)
Pt needs refill on topiramate (TOPAMAX) 100 MG tablet, please send to [facility] in [place]. Thank you

## 2015-05-21 DIAGNOSIS — I8310 Varicose veins of unspecified lower extremity with inflammation: Secondary | ICD-10-CM | POA: Insufficient documentation

## 2015-06-10 ENCOUNTER — Other Ambulatory Visit: Payer: Self-pay | Admitting: Neurology

## 2015-06-10 MED ORDER — TRAMADOL HCL 50 MG PO TABS: 50.0000 mg | ORAL_TABLET | Freq: Four times a day (QID) | ORAL | Status: DC | PRN

## 2015-06-10 NOTE — Telephone Encounter (Signed)
Patient is calling and requesting another Rx tramadol 50 mg be sent to [facility].  Thanks!

## 2015-06-11 ENCOUNTER — Telehealth: Payer: Self-pay | Admitting: *Deleted

## 2015-06-11 NOTE — Telephone Encounter (Signed)
Tramadol rx faxed to [facility] in [place]/fim

## 2015-06-11 NOTE — Telephone Encounter (Signed)
Tramadol rx. faxed to [facility] in [place]

## 2015-07-25 ENCOUNTER — Telehealth: Payer: Self-pay | Admitting: Neurology

## 2015-07-25 MED ORDER — GABAPENTIN 300 MG PO CAPS: ORAL_CAPSULE | ORAL | Status: DC

## 2015-07-25 MED ORDER — TOPIRAMATE 100 MG PO TABS: ORAL_TABLET | ORAL | Status: DC

## 2015-07-25 NOTE — Telephone Encounter (Signed)
Patient called to request refill of topiramate (TOPAMAX) 100 MG tablet and states, Dr. Jannifer Franklin told her she could increase gabapentin (NEURONTIN) 300 MG capsule, with this increase, will run out of medication before time to refilled.

## 2015-07-25 NOTE — Telephone Encounter (Signed)
Prescriptions for the Topamax and gabapentin were called in.

## 2015-09-03 ENCOUNTER — Telehealth: Payer: Self-pay | Admitting: Cardiology

## 2015-09-03 NOTE — Telephone Encounter (Signed)
Received records from [facility] for appointment on 10/04/15 with Dr Percival Spanish.  Records given to [facility] (medical records) for Dr Hochrein's schedule on 10/04/15. lp

## 2015-10-03 NOTE — Progress Notes (Signed)
Cardiology Office Note   Date:  10/04/2015   ID:  [name], DOB 1941-01-23, MRN [id number]  PCP:  Imagene Riches, NP  Cardiologist:   Minus Breeding, MD   Chief Complaint  Patient presents with  . Abnormal Chest CT      History of Present Illness: Frances Robertson is a 75 y.o. 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 I've seen her in the past for evaluation of premature atrial contractions. Some of this was exacerbated by the sudden death of her son at that time. Apparently she's been followed by her primary providers for an enlarged descending thoracic aorta. I see that this was 3.7 cm on recent CT. In addition she was found to have coronary artery calcium in all 3 of her coronary arteries. She's never had a diagnosis of coronary artery disease. She actually has no symptoms. She says she feels well. She works 5 hours a day doing a lot of walking. The patient denies any new symptoms such as chest discomfort, neck or arm discomfort. There has been no new shortness of breath, PND or orthopnea. There have been no reported palpitations, presyncope or syncope.  Past Medical History  Diagnosis Date  . Anemia, unspecified   . Low back pain     herniated disc  . Unspecified hypothyroidism     takes Synthroid daily  . GERD (gastroesophageal reflux disease)     takes Omeprazole daily  . Insomnia     takes Ambien nightly  . PONV (postoperative nausea and vomiting)   . PAC (premature atrial contraction)   . Pneumonia hx of-as a child  . Tinnitus   . Headache(784.0)     takes Topamax daily and Atenolol daily;takes Tramadol daily as well  . Arthritis   . Joint pain   . Joint swelling     Past Surgical History  Procedure Laterality Date  . Carpal tunnel release Bilateral   . Cholecystectomy    . Cataract extraction Bilateral   . Tubal ligation Bilateral   . Esophagogastroduodenoscopy    . Lumbar  laminectomy/decompression microdiscectomy Right 07/23/2014    Procedure: Right Lumbar four-five microdiskectomy;  Surgeon: Newman Pies, MD;  Location: [facility];  Service: Neurosurgery;  Laterality: Right;  Right Lumbar four-five microdiskectomy     Current Outpatient Prescriptions  Medication Sig Dispense Refill  . atenolol (TENORMIN) 50 MG tablet Take 50 mg by mouth daily.    Marland Kitchen gabapentin (NEURONTIN) 300 MG capsule 2 capsules in the morning, 2 at midday, one in the evening 450 capsule 3  . levothyroxine (SYNTHROID, LEVOTHROID) 100 MCG tablet Take 100 mcg by mouth daily before breakfast.    . omeprazole (PRILOSEC) 40 MG capsule Take 40 mg by mouth daily.    Marland Kitchen topiramate (TOPAMAX) 100 MG tablet TAKE 1 TABLET (100 MG TOTAL) BY MOUTH EVERY EVENING. 90 tablet 3  . traMADol (ULTRAM) 50 MG tablet Take 1 tablet (50 mg total) by mouth every 6 (six) hours as needed. 180 tablet 1  . zolpidem (AMBIEN) 10 MG tablet Take 10 mg by mouth at bedtime.      No current facility-administered medications for this visit.    Allergies:   Celandine; Celebrex; Hydrocodone; Neosporin; Other; and Oxycodone    Social History:  The patient  reports that she has never smoked. She has never used smokeless tobacco. She reports that she does not drink alcohol or use illicit drugs.   Family History:  The patient's family history includes Cancer in her other; Cancer - Lung in her brother and brother; Stroke (age of onset: 81) in her father; Sudden death (age of onset: 12) in her son; Venous thrombosis (age of onset: 52) in her mother.    ROS:  Please see the history of present illness.   Otherwise, review of systems are positive for none.   All other systems are reviewed and negative.    PHYSICAL EXAM: VS:  BP 136/72 mmHg  Pulse 74  Ht 5\' 2"  (1.575 m)  Wt 137 lb (62.143 kg)  BMI 25.05 kg/m2 , BMI Body mass index is 25.05 kg/(m^2). GENERAL:  Well appearing HEENT:  Pupils equal round and reactive, fundi not  visualized, oral mucosa unremarkable NECK:  No jugular venous distention, waveform within normal limits, carotid upstroke brisk and symmetric, no bruits, no thyromegaly LYMPHATICS:  No cervical, inguinal adenopathy LUNGS:  Clear to auscultation bilaterally BACK:  No CVA tenderness CHEST:  Unremarkable HEART:  PMI not displaced or sustained,S1 and S2 within normal limits, no S3, no S4, no clicks, no rubs, no murmurs ABD:  Flat, positive bowel sounds normal in frequency in pitch, no bruits, no rebound, no guarding, no midline pulsatile mass, no hepatomegaly, no splenomegaly EXT:  2 plus pulses throughout, no edema, no cyanosis no clubbing SKIN:  No rashes no nodules NEURO:  Cranial nerves II through XII grossly intact, motor grossly intact throughout PSYCH:  Cognitively intact, oriented to person place and time    EKG:  EKG is ordered today. The ekg ordered today demonstrates sinus rhythm with premature atrial contractions, axis within normal limits, intervals within normal limits, no acute ST-T wave changes   Recent Labs: No results found for requested labs within last 365 days.    Lipid Panel No results found for: CHOL, TRIG, HDL, CHOLHDL, VLDL, LDLCALC, LDLDIRECT    Wt Readings from Last 3 Encounters:  10/04/15 137 lb (62.143 kg)  02/28/15 138 lb 9.6 oz (62.869 kg)  07/23/14 133 lb 1 oz (60.357 kg)      Other studies Reviewed: Additional studies/ records that were reviewed today include: Office recoirds. Review of the above records demonstrates:  Please see elsewhere in the note.     ASSESSMENT AND PLAN:  CORONARY CALCIUM:  The patient is found to have coronary calcification. I will plan a stress test. She probably would be a walk on a treadmill adequately. Therefore, she will have a [employer].  Further evaluation and management will be based on this.  AORTIC ANEURYSM:  This is apparently being followed routinely by her primary providers and has not reached the need  for further treatment. I discussed this with her. She was quite anxious about it.  PACs:  These are not causing any symptoms. No change in therapy is indicated.   Current medicines are reviewed at length with the patient today.  The patient does not have concerns regarding medicines.  The following changes have been made:  no change  Labs/ tests ordered today include:   Orders Placed This Encounter  Procedures  . Myocardial Perfusion Imaging     Disposition:   FU with me as needed    Signed, Minus Breeding, MD  10/04/2015 5:25 PM    [facility]

## 2015-10-04 ENCOUNTER — Ambulatory Visit (INDEPENDENT_AMBULATORY_CARE_PROVIDER_SITE_OTHER): Payer: Medicare Other | Admitting: Cardiology

## 2015-10-04 ENCOUNTER — Encounter: Payer: Self-pay | Admitting: Cardiology

## 2015-10-04 ENCOUNTER — Ambulatory Visit: Payer: Medicare Other | Admitting: Cardiology

## 2015-10-04 VITALS — BP 136/72 | HR 74 | Ht 62.0 in | Wt 137.0 lb

## 2015-10-04 DIAGNOSIS — I2584 Coronary atherosclerosis due to calcified coronary lesion: Secondary | ICD-10-CM

## 2015-10-04 DIAGNOSIS — I2510 Atherosclerotic heart disease of native coronary artery without angina pectoris: Secondary | ICD-10-CM | POA: Diagnosis not present

## 2015-10-04 DIAGNOSIS — I712 Thoracic aortic aneurysm, without rupture, unspecified: Secondary | ICD-10-CM

## 2015-10-04 DIAGNOSIS — R079 Chest pain, unspecified: Secondary | ICD-10-CM | POA: Diagnosis not present

## 2015-10-04 NOTE — Patient Instructions (Signed)
Your physician recommends that you schedule a follow-up appointment in: As Needed  Your physician has requested that you have a lexiscan myoview. For further information please visit [email or web address]. Please follow instruction sheet, as given.    

## 2015-10-16 ENCOUNTER — Telehealth (HOSPITAL_COMMUNITY): Payer: Self-pay

## 2015-10-16 NOTE — Telephone Encounter (Signed)
Encounter complete. 

## 2015-10-17 ENCOUNTER — Encounter (HOSPITAL_COMMUNITY): Payer: Medicare Other

## 2015-10-18 ENCOUNTER — Ambulatory Visit (HOSPITAL_COMMUNITY)
Admission: RE | Admit: 2015-10-18 | Discharge: 2015-10-18 | Disposition: A | Discharge status: Home / Self Care | Payer: Medicare Other | Source: Ambulatory Visit | Attending: Cardiology | Admitting: Cardiology

## 2015-10-18 DIAGNOSIS — R079 Chest pain, unspecified: Secondary | ICD-10-CM | POA: Diagnosis not present

## 2015-10-18 DIAGNOSIS — Z8249 Family history of ischemic heart disease and other diseases of the circulatory system: Secondary | ICD-10-CM | POA: Diagnosis not present

## 2015-10-18 LAB — MYOCARDIAL PERFUSION IMAGING
CHL CUP NUCLEAR SRS: 1
CHL CUP NUCLEAR SSS: 1
CSEPPHR: 99 {beats}/min
LV sys vol: 27 mL
LVDIAVOL: 61 mL (ref 46–106)
Rest HR: 73 {beats}/min
SDS: 0
TID: 1.06

## 2015-10-18 MED ORDER — TECHNETIUM TC 99M TETROFOSMIN IV KIT
11.0000 | PACK | Freq: Once | INTRAVENOUS | Status: AC | PRN
  Administered 2015-10-18: 11 via INTRAVENOUS
  Filled 2015-10-18: qty 11

## 2015-10-18 MED ORDER — REGADENOSON 0.4 MG/5ML IV SOLN
0.4000 mg | Freq: Once | INTRAVENOUS | Status: AC
  Administered 2015-10-18: 0.4 mg via INTRAVENOUS

## 2015-10-18 MED ORDER — TECHNETIUM TC 99M TETROFOSMIN IV KIT
30.9000 | PACK | Freq: Once | INTRAVENOUS | Status: AC | PRN
  Administered 2015-10-18: 30.9 via INTRAVENOUS
  Filled 2015-10-18: qty 31

## 2015-10-18 MED ORDER — AMINOPHYLLINE 25 MG/ML IV SOLN
75.0000 mg | Freq: Once | INTRAVENOUS | Status: AC
  Administered 2015-10-18: 75 mg via INTRAVENOUS

## 2015-10-22 NOTE — Addendum Note (Signed)
Addended by: Cristopher Estimable on: 10/22/2015 08:05 AM   Modules accepted: Orders

## 2015-11-04 ENCOUNTER — Telehealth: Payer: Self-pay | Admitting: *Deleted

## 2015-11-04 NOTE — Telephone Encounter (Signed)
-----   Message from Minus Breeding, MD sent at 11/01/2015  9:42 PM EDT ----- She does need to be on ASA.   ----- Message -----    From: Vennie Homans    Sent: 10/28/2015   2:08 PM      To: Minus Breeding, MD  Pt is aware of her stress test, pt stated Heide Scales, NP put her on a baby aspirin, and she want to know if she need to continue taking it, she stated she don't want to if she don't have to take an aspirin. Please advise  Thanks Javonni Macke

## 2015-11-04 NOTE — Telephone Encounter (Signed)
Leave message on pt own voicemail informing pt to continue aspirin as per Dr Percival Spanish.

## 2015-12-20 ENCOUNTER — Other Ambulatory Visit: Payer: Self-pay | Admitting: Neurology

## 2015-12-20 MED ORDER — TRAMADOL HCL 50 MG PO TABS: 50.0000 mg | ORAL_TABLET | Freq: Four times a day (QID) | ORAL | 1 refills | Status: DC | PRN

## 2015-12-20 NOTE — Telephone Encounter (Signed)
Pt was last seen in November and has a 1 yr follow-up appt scheduled w/ Hoyle Sauer NP. Last rx for #180 w/ 1 refill given 06/10/15.

## 2015-12-20 NOTE — Telephone Encounter (Signed)
Pt request refill for traMADol (ULTRAM) 50 MG tablet to [facility]

## 2015-12-23 NOTE — Telephone Encounter (Signed)
Rx printed, signed, faxed to pharmacy. 

## 2016-02-25 ENCOUNTER — Ambulatory Visit (INDEPENDENT_AMBULATORY_CARE_PROVIDER_SITE_OTHER): Payer: Medicare Other | Admitting: Nurse Practitioner

## 2016-02-25 ENCOUNTER — Encounter: Payer: Self-pay | Admitting: Nurse Practitioner

## 2016-02-25 VITALS — BP 130/71 | HR 53 | Resp 20 | Ht 62.0 in | Wt 138.0 lb

## 2016-02-25 DIAGNOSIS — G43019 Migraine without aura, intractable, without status migrainosus: Secondary | ICD-10-CM | POA: Diagnosis not present

## 2016-02-25 NOTE — Patient Instructions (Addendum)
Continue Topamax at current dose Continue tramadol when necessary only Patient pharmacy will change in January  F/U yearly and prn next with Dr. Jannifer Franklin

## 2016-02-25 NOTE — Progress Notes (Signed)
[facility]  PATIENT: Frances Robertson DOB: 06/28/1940   REASON FOR VISIT: Follow-up for migraines , history of low back pain HISTORY FROM: Patient    HISTORY OF PRESENT ILLNESS:Frances Robertson is a 75 year old right-handed white female with a history of migraine headaches. The patient indicates that her headaches are doing relatively well, she is having 1 or 2 headaches a month that are severe. The patient is able to control the headaches within several hours. She had right lumbar micro-diskectomy in April 2016 and she no longer has significant issues with back pain.  She indicates that her headaches are bifrontal in nature, and they usually last 2 or 3 hours when they do occur. The patient is on gabapentin for the back and for the headache, and she takes Topamax 100 mg at night. Patient also reports  sinus headaches. She self medicates for this. She denies side effects to her medications. She fell and broke her wrist in February of this year. She states she has osteoporosis but cannot afford the medication. She takes calcium and vitamin D. 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 She returns for reevaluation.   REVIEW OF SYSTEMS: Full 14 system review of systems performed and notable only for those listed, all others are neg:  Constitutional: neg  Cardiovascular: neg Ear/Nose/Throat: neg  Skin: neg Eyes: neg Respiratory: neg Gastroitestinal: neg  Hematology/Lymphatic: neg  Endocrine: neg Musculoskeletal: Joint pain Allergy/Immunology: neg Neurological: Headache Psychiatric: neg Sleep : Insomnia ALLERGIES: Allergies  Allergen Reactions  . Celandine [Chelidonium Majus] Nausea And Vomiting  . Celebrex [Celecoxib] Nausea And Vomiting  . Hydrocodone Other (See Comments)    sick  . Neosporin [Neomycin-Bacitracin Zn-Polymyx] Rash  . Other Rash and Other (See Comments)    Icy Hot Patches  . Oxycodone Nausea And Vomiting    HOME MEDICATIONS: Outpatient Medications  Prior to Visit  Medication Sig Dispense Refill  . atenolol (TENORMIN) 50 MG tablet Take 50 mg by mouth daily.    Marland Kitchen gabapentin (NEURONTIN) 300 MG capsule 2 capsules in the morning, 2 at midday, one in the evening 450 capsule 3  . levothyroxine (SYNTHROID, LEVOTHROID) 100 MCG tablet Take 100 mcg by mouth daily before breakfast.    . omeprazole (PRILOSEC) 40 MG capsule Take 40 mg by mouth daily.    Marland Kitchen topiramate (TOPAMAX) 100 MG tablet TAKE 1 TABLET (100 MG TOTAL) BY MOUTH EVERY EVENING. 90 tablet 3  . traMADol (ULTRAM) 50 MG tablet Take 1 tablet (50 mg total) by mouth every 6 (six) hours as needed. 180 tablet 1  . zolpidem (AMBIEN) 10 MG tablet Take 10 mg by mouth at bedtime.      No facility-administered medications prior to visit.     PAST MEDICAL HISTORY: Past Medical History:  Diagnosis Date  . Anemia, unspecified   . Arthritis   . GERD (gastroesophageal reflux disease)    takes Omeprazole daily  . Headache(784.0)    takes Topamax daily and Atenolol daily;takes Tramadol daily as well  . Insomnia    takes Ambien nightly  . Joint pain   . Joint swelling   . Low back pain    herniated disc  . PAC (premature atrial contraction)   . Pneumonia hx of-as a child  . PONV (postoperative nausea and vomiting)   . Tinnitus   . Unspecified hypothyroidism    takes Synthroid daily    PAST SURGICAL HISTORY: Past Surgical History:  Procedure Laterality Date  . CARPAL TUNNEL RELEASE Bilateral   .  CATARACT EXTRACTION Bilateral   . CHOLECYSTECTOMY    . ESOPHAGOGASTRODUODENOSCOPY    . LUMBAR LAMINECTOMY/DECOMPRESSION MICRODISCECTOMY Right 07/23/2014   Procedure: Right Lumbar four-five microdiskectomy;  Surgeon: Newman Pies, MD;  Location: [facility];  Service: Neurosurgery;  Laterality: Right;  Right Lumbar four-five microdiskectomy  . TUBAL LIGATION Bilateral     FAMILY HISTORY: Family History  Problem Relation Age of Onset  . Venous thrombosis Mother 66    vague history of this  vs aneurysm somewhere  . Stroke Father 88    Sisters x 2 with breast cancer, brothers x 2 died with lung cancer  . Cancer Other   . Sudden death Son 59  . Cancer - Lung Brother   . Cancer - Lung Brother     SOCIAL HISTORY: Social History   Social History  . Marital status: Widowed    Spouse name: N/A  . Number of children: 2  . Years of education: 9   Occupational History  . [employer].    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     Social History Main Topics  . Smoking status: Never Smoker  . Smokeless tobacco: Never Used  . Alcohol use No  . Drug use: No  . Sexual activity: Not on file   Other Topics Concern  . Not on file   Social History Narrative   Lives alone.    Patient is right handed.   Patient does not drink caffeine.     PHYSICAL EXAM  Vitals:   02/25/16 0807  BP: 130/71  Pulse: (!) 53  Resp: 20  Weight: 138 lb (62.6 kg)  Height: 5\' 2"  (1.575 m)   Body mass index is 25.24 kg/m. General: The patient is alert and cooperative at the time of the examination. Skin: No significant peripheral edema is noted. Neurologic Exam Mental status: The patient is alert and oriented x 3 at the time of the examination. The patient has apparent normal recent and remote memory, with an apparently normal attention span and concentration ability. Cranial nerves: Facial symmetry is present. Speech is normal, no aphasia or dysarthria is noted. Extraocular movements are full. Visual fields are full. Motor: The patient has good strength in all 4 extremities. Sensory examination: Soft touch, pinprick and vibratory is  symmetric in the upper and lower extremities Coordination: The patient has good finger-nose-finger and heel-to-shin bilaterally. Gait and station: The patient has a normal gait. Tandem gait is mildly unsteady. Romberg is negative. No drift is seen. Reflexes: Deep tendon reflexes are symmetric. DIAGNOSTIC DATA (LABS, IMAGING,  TESTING) - I reviewed patient records, labs, notes, testing and imaging myself where available.  Lab Results  Component Value Date   WBC 4.8 07/12/2014   HGB 11.0 (L) 07/12/2014   HCT 34.4 (L) 07/12/2014   MCV 88.7 07/12/2014   PLT 168 07/12/2014      Component Value Date/Time   NA 140 07/12/2014 1126   K 4.5 07/12/2014 1126   CL 107 07/12/2014 1126   CO2 25 07/12/2014 1126   GLUCOSE 105 (H) 07/12/2014 1126   BUN 16 07/12/2014 1126   CREATININE 0.70 07/12/2014 1126   CALCIUM 9.5 07/12/2014 1126   GFRNONAA 83 (L) 07/12/2014 1126   GFRAA >90 07/12/2014 1126    ASSESSMENT AND PLAN  75 y.o. year old female  has a past medical history of  Headache(784.0); and migraine here to follow-up.  Continue Topamax at current dose Continue tramadol when necessary only Patients  Pharmacy will change in January she will let us know Current refills until April of next year F/U yearly and prn, next with Dr. Jerline Pain, [facility], [facility], [facility] [address], [place], [place] [address] [phone]

## 2016-02-25 NOTE — Progress Notes (Signed)
I have read the note, and I agree with the clinical assessment and plan.  Frances Robertson,Frances Robertson   

## 2016-03-02 ENCOUNTER — Ambulatory Visit: Payer: Medicare Other | Admitting: Nurse Practitioner

## 2016-04-08 ENCOUNTER — Other Ambulatory Visit: Payer: Self-pay | Admitting: Nurse Practitioner

## 2016-04-08 MED ORDER — TRAMADOL HCL 50 MG PO TABS: 50.0000 mg | ORAL_TABLET | Freq: Four times a day (QID) | ORAL | 1 refills | Status: DC | PRN

## 2016-04-08 NOTE — Telephone Encounter (Signed)
Pt request refill for traMADol (ULTRAM) 50 MG tablet sent to [facility]

## 2016-04-08 NOTE — Telephone Encounter (Signed)
Received fax confirmation ultram [phone].

## 2016-05-07 DIAGNOSIS — R1010 Upper abdominal pain, unspecified: Secondary | ICD-10-CM | POA: Diagnosis not present

## 2016-06-11 DIAGNOSIS — G4700 Insomnia, unspecified: Secondary | ICD-10-CM | POA: Diagnosis not present

## 2016-07-07 DIAGNOSIS — R1010 Upper abdominal pain, unspecified: Secondary | ICD-10-CM | POA: Diagnosis not present

## 2016-07-07 DIAGNOSIS — R109 Unspecified abdominal pain: Secondary | ICD-10-CM | POA: Diagnosis not present

## 2016-07-07 DIAGNOSIS — I700 Atherosclerosis of aorta: Secondary | ICD-10-CM | POA: Diagnosis not present

## 2016-07-13 ENCOUNTER — Other Ambulatory Visit: Payer: Self-pay | Admitting: Nurse Practitioner

## 2016-07-13 NOTE — Telephone Encounter (Signed)
Tramadol last filled 04-08-16 180 tabs refill x 1.  Has appt with Dr. Jannifer Franklin in 02/2017.

## 2016-07-14 NOTE — Telephone Encounter (Signed)
Faxed printed/signed rx tramadol to pt pharmacy. Fax: [phone]. Received confirmation.

## 2016-07-15 ENCOUNTER — Other Ambulatory Visit: Payer: Self-pay | Admitting: Neurology

## 2016-08-20 ENCOUNTER — Other Ambulatory Visit: Payer: Self-pay | Admitting: Neurology

## 2016-08-20 NOTE — Telephone Encounter (Signed)
Faxed printed/signed rx tramadol to pt pharmacy. Fax: [phone]. Received confirmation.

## 2016-09-09 DIAGNOSIS — G4700 Insomnia, unspecified: Secondary | ICD-10-CM | POA: Diagnosis not present

## 2016-09-18 DIAGNOSIS — Z1231 Encounter for screening mammogram for malignant neoplasm of breast: Secondary | ICD-10-CM | POA: Diagnosis not present

## 2016-09-25 ENCOUNTER — Other Ambulatory Visit: Payer: Self-pay | Admitting: Neurology

## 2016-10-07 ENCOUNTER — Other Ambulatory Visit: Payer: Self-pay | Admitting: Neurology

## 2016-10-07 NOTE — Telephone Encounter (Signed)
Faxed printed/signed rx tramadol to pt pharmacy. Fax: [phone]. Received confirmation.

## 2016-10-26 DIAGNOSIS — R51 Headache: Secondary | ICD-10-CM | POA: Diagnosis not present

## 2016-10-26 DIAGNOSIS — J0110 Acute frontal sinusitis, unspecified: Secondary | ICD-10-CM | POA: Diagnosis not present

## 2016-11-20 ENCOUNTER — Other Ambulatory Visit: Payer: Self-pay | Admitting: Neurology

## 2016-11-20 NOTE — Telephone Encounter (Signed)
Faxed printed/signed rx tramadol to patient pharmacy. Fax: [phone]. Received confirmation.

## 2016-12-04 DIAGNOSIS — G4700 Insomnia, unspecified: Secondary | ICD-10-CM | POA: Diagnosis not present

## 2016-12-08 DIAGNOSIS — C44629 Squamous cell carcinoma of skin of left upper limb, including shoulder: Secondary | ICD-10-CM | POA: Diagnosis not present

## 2016-12-25 DIAGNOSIS — J019 Acute sinusitis, unspecified: Secondary | ICD-10-CM | POA: Diagnosis not present

## 2016-12-28 ENCOUNTER — Other Ambulatory Visit: Payer: Self-pay | Admitting: Neurology

## 2016-12-28 NOTE — Telephone Encounter (Signed)
Faxed printed/signed rx tramadol to [facility] [place] at [phone]. Received fax confirmation.

## 2017-02-04 DIAGNOSIS — R05 Cough: Secondary | ICD-10-CM | POA: Diagnosis not present

## 2017-02-12 ENCOUNTER — Other Ambulatory Visit: Payer: Self-pay | Admitting: Neurology

## 2017-02-15 NOTE — Telephone Encounter (Signed)
Faxed printed/signed rx tramadol to [facility] [address] at [phone]. Received fax confirmation.

## 2017-02-17 DIAGNOSIS — R109 Unspecified abdominal pain: Secondary | ICD-10-CM | POA: Diagnosis not present

## 2017-02-17 DIAGNOSIS — R110 Nausea: Secondary | ICD-10-CM | POA: Diagnosis not present

## 2017-02-17 DIAGNOSIS — R51 Headache: Secondary | ICD-10-CM | POA: Diagnosis not present

## 2017-02-17 DIAGNOSIS — N3000 Acute cystitis without hematuria: Secondary | ICD-10-CM | POA: Diagnosis not present

## 2017-02-17 DIAGNOSIS — N83201 Unspecified ovarian cyst, right side: Secondary | ICD-10-CM | POA: Diagnosis not present

## 2017-02-17 DIAGNOSIS — R112 Nausea with vomiting, unspecified: Secondary | ICD-10-CM | POA: Diagnosis not present

## 2017-02-17 DIAGNOSIS — R1110 Vomiting, unspecified: Secondary | ICD-10-CM | POA: Diagnosis not present

## 2017-02-17 DIAGNOSIS — K5909 Other constipation: Secondary | ICD-10-CM | POA: Diagnosis not present

## 2017-02-24 ENCOUNTER — Ambulatory Visit (INDEPENDENT_AMBULATORY_CARE_PROVIDER_SITE_OTHER): Payer: PPO | Admitting: Neurology

## 2017-02-24 ENCOUNTER — Encounter: Payer: Self-pay | Admitting: Neurology

## 2017-02-24 VITALS — BP 161/85 | HR 75 | Ht 62.0 in | Wt 141.0 lb

## 2017-02-24 DIAGNOSIS — G8929 Other chronic pain: Secondary | ICD-10-CM

## 2017-02-24 DIAGNOSIS — G43019 Migraine without aura, intractable, without status migrainosus: Secondary | ICD-10-CM

## 2017-02-24 DIAGNOSIS — M5440 Lumbago with sciatica, unspecified side: Secondary | ICD-10-CM

## 2017-02-24 MED ORDER — GABAPENTIN 600 MG PO TABS: 600.0000 mg | ORAL_TABLET | Freq: Three times a day (TID) | ORAL | Status: DC

## 2017-02-24 NOTE — Patient Instructions (Signed)
   We will go up on the gabapentin to 600 mg three times a day on the next prescription refill.

## 2017-02-24 NOTE — Progress Notes (Signed)
Reason for visit: Headache  Frances Robertson is an 76 y.o. female  History of present illness:  Frances Robertson is a 76 year old right-handed white female with a history of chronic migraine headache.  The patient no longer has frequent severe headaches, she has gained benefit with Topamax taking 100 mg at night.  She may have 1 or 2 severe headaches a year.  The patient however does have chronic daily headaches that are much more mild, and seem to respond to gabapentin, but the headaches may return prior to each dosing timeframe.  The patient has back pain and bilateral lower extremity discomfort, she takes Ultram frequently throughout the day, 1 tablet every 4 hours.  The patient is sleeping fairly well at night.  She continues to work.  The patient overall is satisfied with her ability to function during the day.  She returns to the office for further evaluation.  The patient also reports ice pick pains that may occur intermittently on the head.  Past Medical History:  Diagnosis Date  . Anemia, unspecified   . Arthritis   . GERD (gastroesophageal reflux disease)    takes Omeprazole daily  . Headache(784.0)    takes Topamax daily and Atenolol daily;takes Tramadol daily as well  . Insomnia    takes Ambien nightly  . Joint pain   . Joint swelling   . Low back pain    herniated disc  . PAC (premature atrial contraction)   . Pneumonia hx of-as a child  . PONV (postoperative nausea and vomiting)   . Tinnitus   . Unspecified hypothyroidism    takes Synthroid daily    Past Surgical History:  Procedure Laterality Date  . CARPAL TUNNEL RELEASE Bilateral   . CATARACT EXTRACTION Bilateral   . CHOLECYSTECTOMY    . ESOPHAGOGASTRODUODENOSCOPY    . TUBAL LIGATION Bilateral     Family History  Problem Relation Age of Onset  . Venous thrombosis Mother 81       vague history of this vs aneurysm somewhere  . Stroke Father 27       Sisters x 2 with breast cancer, brothers x 2 died with lung  cancer  . Cancer Other   . Cancer - Lung Brother   . Cancer - Lung Brother   . Sudden death Son 77    Social history:  reports that  has never smoked. she has never used smokeless tobacco. She reports that she does not drink alcohol or use drugs.    Allergies  Allergen Reactions  . Celandine [Chelidonium Majus] Nausea And Vomiting  . Celebrex [Celecoxib] Nausea And Vomiting  . Hydrocodone Other (See Comments)    sick  . Neosporin [Neomycin-Bacitracin Zn-Polymyx] Rash  . Other Rash and Other (See Comments)    Icy Hot Patches  . Oxycodone Nausea And Vomiting    Medications:  Prior to Admission medications   Medication Sig Start Date End Date Taking? Authorizing Provider  atenolol (TENORMIN) 50 MG tablet Take 50 mg by mouth daily.   Yes [provider]  gabapentin (NEURONTIN) 300 MG capsule TAKE 2 CAPS EACH MORNING, 2 CAPS MIDDAY AND THEN 1 CAPSULE EACH EVENING 09/25/16  Yes Dennie Bible, NP  levothyroxine (SYNTHROID, LEVOTHROID) 100 MCG tablet Take 100 mcg by mouth daily before breakfast.   Yes [provider]  omeprazole (PRILOSEC) 40 MG capsule Take 40 mg by mouth daily.   Yes [provider]  topiramate (TOPAMAX) 100 MG tablet TAKE 1 TABLET (  100 MG TOTAL) BY MOUTH EVERY EVENING. 07/15/16  Yes Kathrynn Ducking, MD  traMADol (ULTRAM) 50 MG tablet TAKE 1 TABLET BY MOUTH EVERY 6 HOURS AS NEEDED Patient taking differently: TAKE 1 TABLET BY MOUTH EVERY 4 HOURS AS NEEDED 02/12/17  Yes Kathrynn Ducking, MD  zolpidem (AMBIEN) 10 MG tablet Take 10 mg by mouth at bedtime.    Yes [provider]    ROS:  Out of a complete 14 system review of symptoms, the patient complains only of the following symptoms, and all other reviewed systems are negative.  Headache Insomnia Joint pain  Blood pressure (!) 161/85, pulse 75, height 5\' 2"  (1.575 m), weight 141 lb (64 kg), SpO2 98 %.  Physical Exam  General: The patient is alert and cooperative at  the time of the examination.  Skin: No significant peripheral edema is noted.   Neurologic Exam  Mental status: The patient is alert and oriented x 3 at the time of the examination. The patient has apparent normal recent and remote memory, with an apparently normal attention span and concentration ability.   Cranial nerves: Facial symmetry is present. Speech is normal, no aphasia or dysarthria is noted. Extraocular movements are full. Visual fields are full.  Motor: The patient has good strength in all 4 extremities.  Sensory examination: Soft touch sensation is symmetric on the face, arms, and legs.  Coordination: The patient has good finger-nose-finger and heel-to-shin bilaterally.  Gait and station: The patient has a normal gait. Tandem gait is normal. Romberg is negative. No drift is seen.  Reflexes: Deep tendon reflexes are symmetric.   Assessment/Plan:  1.  Migraine headache, chronic daily headache  2.  Low back pain, leg pain  The patient will be increased on the gabapentin taking 600 mg 3 times daily on her next prescription.  The patient will remain on Topamax, this dose could be increased if needed in the future.  The patient will follow-up through this office in about 1 year, sooner if needed.   Jill Alexanders MD 02/24/2017 8:08 AM  [facility] [address] [place], [place] [address]  Phone [phone] Fax [phone]

## 2017-03-29 ENCOUNTER — Other Ambulatory Visit: Payer: Self-pay | Admitting: *Deleted

## 2017-03-29 ENCOUNTER — Other Ambulatory Visit: Payer: Self-pay | Admitting: Nurse Practitioner

## 2017-03-29 MED ORDER — TOPIRAMATE 100 MG PO TABS: ORAL_TABLET | ORAL | 3 refills | Status: DC

## 2017-04-08 ENCOUNTER — Other Ambulatory Visit: Payer: Self-pay | Admitting: *Deleted

## 2017-04-08 MED ORDER — GABAPENTIN 600 MG PO TABS: 600.0000 mg | ORAL_TABLET | Freq: Three times a day (TID) | ORAL | 3 refills | Status: DC

## 2017-04-09 ENCOUNTER — Telehealth: Payer: Self-pay | Admitting: Neurology

## 2017-04-09 ENCOUNTER — Other Ambulatory Visit: Payer: Self-pay | Admitting: Neurology

## 2017-04-09 NOTE — Telephone Encounter (Signed)
Pt request refill for traMADol (ULTRAM) 50 MG tablet [facility]. Pt is out of the medication. [facility]ill has been [facility]ed to the clinic twice

## 2017-04-09 NOTE — Telephone Encounter (Signed)
A prescription for Ultram was written.

## 2017-04-14 MED ORDER — TRAMADOL HCL 50 MG PO TABS: 50.0000 mg | ORAL_TABLET | Freq: Four times a day (QID) | ORAL | 0 refills | Status: DC | PRN

## 2017-04-14 NOTE — Addendum Note (Signed)
Addended by: Hope Pigeon on: 04/14/2017 02:51 PM   Modules accepted: Orders

## 2017-04-14 NOTE — Telephone Encounter (Signed)
Rx was not printed previously. "Fax" was selected previously. I printed rx. Awaiting signature from [facility], Dr. Felecia Shelling

## 2017-04-14 NOTE — Telephone Encounter (Signed)
Dr. Jannifer Franklin routed this back to me on Friday after I had already left; do you happen to remember if he left this Rx on your desk?

## 2017-04-15 ENCOUNTER — Telehealth: Payer: Self-pay | Admitting: Neurology

## 2017-04-15 MED ORDER — GABAPENTIN 300 MG PO CAPS: 600.0000 mg | ORAL_CAPSULE | Freq: Three times a day (TID) | ORAL | 11 refills | Status: DC

## 2017-04-15 NOTE — Telephone Encounter (Signed)
Pt was recently switched to tablets for gabapentin (NEURONTIN) 600 MG tablet but would like to go back to the capsules. Pt says the tablets are too big for her to swallow

## 2017-04-15 NOTE — Telephone Encounter (Signed)
Called patient. Advised we are going to send in new rx for gabapentin 300mg  capsules for her to take 2 capsules TID. She verbalized understanding. I verified pharmacy on file.

## 2017-04-15 NOTE — Telephone Encounter (Signed)
Rx tramadol re[facility]ed [facility] at [phone]. Received fax confirmation.

## 2017-04-15 NOTE — Telephone Encounter (Addendum)
Per Dr. Krista Blue- okay to call in capsules instead of tablets.  Gabapentin 300mg  capsules ( 2 capsules three times daily).

## 2017-05-27 ENCOUNTER — Telehealth: Payer: Self-pay | Admitting: Neurology

## 2017-05-27 MED ORDER — TOPIRAMATE 100 MG PO TABS: ORAL_TABLET | ORAL | 3 refills | Status: DC

## 2017-05-27 NOTE — Addendum Note (Signed)
Addended by: Kathrynn Ducking on: 05/27/2017 04:47 PM   Modules accepted: Orders

## 2017-05-27 NOTE — Telephone Encounter (Signed)
I called the patient.  The patient apparently has run out of her medication, the prescription she has on the bottle does not correlate with will be called in pharmacy in December 2018.  I have sent another prescription in, the patient will need to check the bottle when she picks it up to make sure it is accurate.

## 2017-05-27 NOTE — Telephone Encounter (Signed)
Pt called requesing a refill for topiramate (TOPAMAX) 100 MG tablet [facility]

## 2017-05-27 NOTE — Telephone Encounter (Signed)
Called patient back. Advised last rx topamax sent 03/29/17 qty 90 with 3 refills. She states rx bottle says no refills. She filled on 04/02/17.   [facility] Pt was filling old prescription. They had our prescription on file. Pt can pick up a refill on or after 06/03/17. I spoke with Heather.   I called pt back. relayed information above. She claims she only has 5 tablets left. She filled on 04/02/17 for a 90 day supply. I explained she should have tablets left. Pt unsure what happened. She only takes one daily. She keeps bottle in nightstand.  She is aware she can pick up on 06/03/17.

## 2017-07-05 ENCOUNTER — Other Ambulatory Visit: Payer: Self-pay | Admitting: Neurology

## 2017-07-05 MED ORDER — TRAMADOL HCL 50 MG PO TABS: 50.0000 mg | ORAL_TABLET | Freq: Four times a day (QID) | ORAL | 0 refills | Status: DC | PRN

## 2017-07-05 NOTE — Telephone Encounter (Signed)
Pt request refill for traMADol (ULTRAM) 50 MG tablet. Sent to [facility]. Please update pharmacy

## 2017-08-16 ENCOUNTER — Telehealth: Payer: Self-pay | Admitting: Neurology

## 2017-08-16 MED ORDER — TRAMADOL HCL 50 MG PO TABS: 50.0000 mg | ORAL_TABLET | Freq: Four times a day (QID) | ORAL | 1 refills | Status: DC | PRN

## 2017-08-16 NOTE — Telephone Encounter (Signed)
A prescription for the Ultram will be refilled.  The patient apparently is taking 4-day, would like to reduce the dosing to 3 a day, will give 180 tablets with 1 refill, each refill must last 60 days.

## 2017-08-16 NOTE — Telephone Encounter (Signed)
Pt request refill for traMADol (ULTRAM) 50 MG tablet sent to [facility]. [facility], [facility].

## 2017-11-01 ENCOUNTER — Ambulatory Visit (INDEPENDENT_AMBULATORY_CARE_PROVIDER_SITE_OTHER): Payer: Medicare Other

## 2017-11-01 ENCOUNTER — Encounter: Payer: Self-pay | Admitting: Podiatry

## 2017-11-01 ENCOUNTER — Ambulatory Visit: Payer: Medicare Other | Admitting: Podiatry

## 2017-11-01 ENCOUNTER — Encounter: Payer: Self-pay | Admitting: *Deleted

## 2017-11-01 VITALS — BP 139/74 | HR 69 | Temp 98.7°F | Resp 16 | Ht 62.0 in | Wt 142.0 lb

## 2017-11-01 DIAGNOSIS — M9271 Juvenile osteochondrosis of metatarsus, right foot: Secondary | ICD-10-CM | POA: Diagnosis not present

## 2017-11-01 DIAGNOSIS — M79671 Pain in right foot: Secondary | ICD-10-CM | POA: Diagnosis not present

## 2017-11-01 DIAGNOSIS — M779 Enthesopathy, unspecified: Secondary | ICD-10-CM

## 2017-11-01 DIAGNOSIS — G8929 Other chronic pain: Secondary | ICD-10-CM | POA: Diagnosis not present

## 2017-11-01 DIAGNOSIS — R269 Unspecified abnormalities of gait and mobility: Secondary | ICD-10-CM | POA: Diagnosis not present

## 2017-11-01 MED ORDER — MELOXICAM 7.5 MG PO TABS: 7.5000 mg | ORAL_TABLET | Freq: Every day | ORAL | 0 refills | Status: DC

## 2017-11-01 NOTE — Progress Notes (Signed)
   Subjective:    Patient ID: Frances Robertson, female    DOB: 16-Apr-1940, 77 y.o.   MRN: [id number]  HPI    Review of Systems  Skin: Positive for color change.  All other systems reviewed and are negative.      Objective:   Physical Exam        Assessment & Plan:

## 2017-11-01 NOTE — Patient Instructions (Signed)
Avascular Necrosis Avascular necrosis is a disease resulting from the temporary or permanent loss of blood supply to a bone. This disease may also be known as:  Osteonecrosis.  Aseptic necrosis.  Ischemic bone necrosis.  Without proper blood supply, the internal layer of the affected bone dies and the outer layer of the bone may break down. If this process affects a bone near a joint, it may lead to collapse of that joint. Common bones that are affected by this condition include:  The top of your thigh bone (femoral head).  One or more bones in your wrist (scaphoid orlunate).  One or more bones in your foot (metatarsals).  One of the bones in your ankle (navicular).  The joint most commonly affected by this condition is the hip joint. Avascular necrosis rarely occurs in more than one bone at a time. What are the causes?  Damage or injury to a bone or joint.  Using corticosteroid medicine for a long period of time.  Changes in your immune or hormone systems.  Excessive exposure to radiation. What increases the risk?  Alcohol abuse.  Previous traumatic injury to a joint.  Using corticosteroid medicines for a long period of time or often.  Having a medical condition such as: ? HIV or AIDS. ? Diabetes. ? Sickle cell disease. ? An autoimmune disease. What are the signs or symptoms? The main symptoms of avascular necrosis are pain and decreased motion in the affected bone or joint. In the early stages the pain may be minor and occur only with activity. As avascular necrosis progresses, pain may gradually worsen and occur while at rest. The pain may suddenly become severe if an affected joint collapses. How is this diagnosed? Avascular necrosis may be diagnosed with:  A medical history.  A physical exam.  X-rays.  An MRI.  A bone scan.  How is this treated? Treatments may include:  Medicine to help relieve pain.  Avoiding placing any pressure or weight  ontheaffected area. If avascular necrosis occurs in your hip, ankle, or foot, you may be instructed to use crutches or a rolling scooter.  Surgery, such as: ? Core decompression. In this surgery, one or more holes are placed in the bone for new blood vessels to grow into. This provides a renewed blood supply to the bone. Core decompression can often reduce pain and pressure in the affected bone and slow the progression of bone and joint destruction. ? Osteotomy. In this surgery, the bone is reshaped to reduce stress on the affected area of the joint. ? Bone grafting. In this surgery, healthy bone from one part of your body is transplanted to the affected area. ? Arthroplasty. Arthroplasty is also known as total joint replacement. In this surgery, the affected surface on one or both sides of a joint is replaced with artificial parts (prostheses).  Electrical stimulation. This may help encourage new bone growth.  Follow these instructions at home:  Take medicines only as directed by your health care provider.  Follow your health care provider's recommendations on limiting activities or using crutches to rest your affected joint.  Meet with aphysical therapist as directed by your health care provider.  Keep all follow-up visits as directed by your health care provider. This is important. Contact a health care provider if:  Your pain worsens.  You have decreased motion in your affected joint. Get help right away if: Your pain suddenly becomes severe. This information is not intended to replace advice given to you  by your health care provider. Make sure you discuss any questions you have with your health care provider. Document Released: 09/19/2001 Document Revised: 09/05/2015 Document Reviewed: 06/07/2013 Elsevier Interactive Patient Education  [employer].

## 2017-11-01 NOTE — Progress Notes (Signed)
Subjective:  Patient ID: Frances Robertson, female    DOB: 02-26-1941,  MRN: [id number]  Chief Complaint  Patient presents with  . redness    R 2-4th MPJ x 2 mo; no pain -no injury Tx: none -Foot has been warm, and w/ light swelling    77 y.o. female presents with the above complaint.  Reports redness and warmth to the forefoot area x2 months.  Denies pain.  Denies injury.  States the foot is been warm with light swelling.  Not really having much pain at the area.  Past Medical History:  Diagnosis Date  . Anemia, unspecified   . Arthritis   . GERD (gastroesophageal reflux disease)    takes Omeprazole daily  . Headache(784.0)    takes Topamax daily and Atenolol daily;takes Tramadol daily as well  . Insomnia    takes Ambien nightly  . Joint pain   . Joint swelling   . Low back pain    herniated disc  . PAC (premature atrial contraction)   . Pneumonia hx of-as a child  . PONV (postoperative nausea and vomiting)   . Tinnitus   . Unspecified hypothyroidism    takes Synthroid daily   Past Surgical History:  Procedure Laterality Date  . CARPAL TUNNEL RELEASE Bilateral   . CATARACT EXTRACTION Bilateral   . CHOLECYSTECTOMY    . ESOPHAGOGASTRODUODENOSCOPY    . LUMBAR LAMINECTOMY/DECOMPRESSION MICRODISCECTOMY Right 07/23/2014   Procedure: Right Lumbar four-five microdiskectomy;  Surgeon: Newman Pies, MD;  Location: [facility];  Service: Neurosurgery;  Laterality: Right;  Right Lumbar four-five microdiskectomy  . TUBAL LIGATION Bilateral     Current Outpatient Medications:  .  atenolol (TENORMIN) 50 MG tablet, Take 50 mg by mouth daily., Disp: , Rfl:  .  gabapentin (NEURONTIN) 300 MG capsule, Take 2 capsules (600 mg total) by mouth 3 (three) times daily., Disp: 90 capsule, Rfl: 11 .  levothyroxine (SYNTHROID, LEVOTHROID) 100 MCG tablet, Take 100 mcg by mouth daily before breakfast., Disp: , Rfl:  .  omeprazole (PRILOSEC) 40 MG capsule, Take 40 mg by mouth daily., Disp: , Rfl:    .  topiramate (TOPAMAX) 100 MG tablet, TAKE 1 TABLET (100 MG TOTAL) BY MOUTH EVERY EVENING., Disp: 90 tablet, Rfl: 3 .  traMADol (ULTRAM) 50 MG tablet, Take 1 tablet (50 mg total) by mouth every 6 (six) hours as needed. Must last 60 days, Disp: 180 tablet, Rfl: 1 .  zolpidem (AMBIEN) 10 MG tablet, Take 10 mg by mouth at bedtime. , Disp: , Rfl:  .  meloxicam (MOBIC) 7.5 MG tablet, Take 1 tablet (7.5 mg total) by mouth daily., Disp: 30 tablet, Rfl: 0  Allergies  Allergen Reactions  . Celandine [Chelidonium Majus] Nausea And Vomiting  . Celebrex [Celecoxib] Nausea And Vomiting  . Hydrocodone Other (See Comments)    sick  . Neosporin [Neomycin-Bacitracin Zn-Polymyx] Rash  . Other Rash and Other (See Comments)    Icy Hot Patches  . Oxycodone Nausea And Vomiting   Review of Systems: Negative except as noted in the HPI. Denies N/V/F/Ch. Objective:  There were no vitals filed for this visit. General AA&O x3. Normal mood and affect.  Vascular Dorsalis pedis and posterior tibial pulses  present 2+ bilaterally  Capillary refill normal to all digits. Pedal hair growth normal.  Neurologic Epicritic sensation grossly present.  Dermatologic No open lesions. Interspaces clear of maceration. Nails well groomed and normal in appearance.  Focal warmth and redness at present at the 2nd/3rd MTPJs  Orthopedic: MMT 5/5 in dorsiflexion, plantarflexion, inversion, and eversion. Normal joint ROM without pain or crepitus. POP L 2nd MPJ.   Assessment & Plan:  Patient was evaluated and treated and all questions answered.  Freiberg Infarction -XR taken reviewed flattening of the second metatarsal head noted. No acute fracture. -Educated on etiology -Will immobilize in CAM boot. Boot dispensed. -Rx Mobic  No follow-ups on file.

## 2017-11-03 ENCOUNTER — Other Ambulatory Visit: Payer: Self-pay | Admitting: Podiatry

## 2017-11-03 DIAGNOSIS — M79671 Pain in right foot: Secondary | ICD-10-CM

## 2017-11-03 DIAGNOSIS — R269 Unspecified abnormalities of gait and mobility: Secondary | ICD-10-CM

## 2017-11-03 DIAGNOSIS — M779 Enthesopathy, unspecified: Secondary | ICD-10-CM

## 2017-11-03 DIAGNOSIS — M9271 Juvenile osteochondrosis of metatarsus, right foot: Secondary | ICD-10-CM

## 2017-11-03 DIAGNOSIS — G8929 Other chronic pain: Secondary | ICD-10-CM

## 2017-11-25 ENCOUNTER — Telehealth: Payer: Self-pay | Admitting: Neurology

## 2017-11-25 MED ORDER — PREDNISONE 5 MG PO TABS: ORAL_TABLET | ORAL | 0 refills | Status: DC

## 2017-11-25 MED ORDER — GABAPENTIN 300 MG PO CAPS: 600.0000 mg | ORAL_CAPSULE | Freq: Three times a day (TID) | ORAL | 5 refills | Status: DC

## 2017-11-25 NOTE — Telephone Encounter (Signed)
Pt called requesting refills for gabapentin (NEURONTIN) 300 MG capsule sent to [facility]. Also wanting to inform Dr. Jannifer Franklin that her migraines have returned.

## 2017-11-25 NOTE — Telephone Encounter (Signed)
I have refilled the medication gabapentin 600mg  po TID to [facility] for 6 months.

## 2017-11-25 NOTE — Addendum Note (Signed)
Addended by: Kathrynn Ducking on: 11/25/2017 04:36 PM   Modules accepted: Orders

## 2017-11-25 NOTE — Telephone Encounter (Signed)
I called the patient.  The patient remains on gabapentin and Topamax for headaches, she had been doing quite well with her migraines until 2 weeks ago when the headache started coming back on daily.  I will give her a 6-day course of prednisone, if this does not abate the headaches, she is to contact our office and we will need to make some adjustments on her medication dosing.

## 2017-12-01 MED ORDER — DEXAMETHASONE 2 MG PO TABS: ORAL_TABLET | ORAL | 0 refills | Status: DC

## 2017-12-01 MED ORDER — TOPIRAMATE 50 MG PO TABS: 150.0000 mg | ORAL_TABLET | Freq: Every day | ORAL | 1 refills | Status: DC

## 2017-12-01 NOTE — Telephone Encounter (Signed)
I called the patient.  The patient is having ongoing headaches, almost daily in nature.  The short course of prednisone previously did help transiently but the headache came back afterwards.  We will go up on the Topamax take 150 mg at night, she will be given a 3-day course of Decadron.  In the future, may need to consider another medication such as Aimovig or Ajovy.

## 2017-12-01 NOTE — Telephone Encounter (Signed)
Patient states with taking Gabapentin, Topamax and Prednisone she continues to have migraines. She is not sure what she needs to do. Please call and discuss. She uses [facility] in [place].

## 2017-12-01 NOTE — Addendum Note (Signed)
Addended by: Kathrynn Ducking on: 12/01/2017 10:09 AM   Modules accepted: Orders

## 2017-12-06 ENCOUNTER — Ambulatory Visit: Payer: Medicare Other | Admitting: Podiatry

## 2017-12-20 ENCOUNTER — Ambulatory Visit: Payer: Medicare Other | Admitting: Podiatry

## 2017-12-20 DIAGNOSIS — M79671 Pain in right foot: Secondary | ICD-10-CM

## 2017-12-20 DIAGNOSIS — M779 Enthesopathy, unspecified: Secondary | ICD-10-CM | POA: Diagnosis not present

## 2017-12-20 DIAGNOSIS — M9271 Juvenile osteochondrosis of metatarsus, right foot: Secondary | ICD-10-CM

## 2017-12-20 DIAGNOSIS — G8929 Other chronic pain: Secondary | ICD-10-CM

## 2017-12-20 NOTE — Progress Notes (Signed)
Subjective:  Patient ID: Frances Robertson, female    DOB: October 09, 1940,  MRN: [id number]  Chief Complaint  Patient presents with  . freiberg    F/U R foot Pt.s tated," It looks better and it feels a lot better than it did." Tx: cam boot    77 y.o. female presents with the above complaint. States the pain is almost gone. Feels a lot better and is no longer red or swollen.  Review of Systems: Negative except as noted in the HPI. Denies N/V/F/Ch.  Past Medical History:  Diagnosis Date  . Anemia, unspecified   . Arthritis   . GERD (gastroesophageal reflux disease)    takes Omeprazole daily  . Headache(784.0)    takes Topamax daily and Atenolol daily;takes Tramadol daily as well  . Insomnia    takes Ambien nightly  . Joint pain   . Joint swelling   . Low back pain    herniated disc  . PAC (premature atrial contraction)   . Pneumonia hx of-as a child  . PONV (postoperative nausea and vomiting)   . Tinnitus   . Unspecified hypothyroidism    takes Synthroid daily    Current Outpatient Medications:  .  atenolol (TENORMIN) 50 MG tablet, Take 50 mg by mouth daily., Disp: , Rfl:  .  dexamethasone (DECADRON) 2 MG tablet, Take 3 tablets the first day, 2 the second and 1 the third day, Disp: 6 tablet, Rfl: 0 .  gabapentin (NEURONTIN) 300 MG capsule, Take 2 capsules (600 mg total) by mouth 3 (three) times daily., Disp: 180 capsule, Rfl: 5 .  levothyroxine (SYNTHROID, LEVOTHROID) 100 MCG tablet, Take 100 mcg by mouth daily before breakfast., Disp: , Rfl:  .  meloxicam (MOBIC) 7.5 MG tablet, Take 1 tablet (7.5 mg total) by mouth daily., Disp: 30 tablet, Rfl: 0 .  omeprazole (PRILOSEC) 40 MG capsule, Take 40 mg by mouth daily., Disp: , Rfl:  .  topiramate (TOPAMAX) 50 MG tablet, Take 3 tablets (150 mg total) by mouth at bedtime., Disp: 270 tablet, Rfl: 1 .  traMADol (ULTRAM) 50 MG tablet, Take 1 tablet (50 mg total) by mouth every 6 (six) hours as needed. Must last 60 days, Disp: 180 tablet,  Rfl: 1 .  zolpidem (AMBIEN) 10 MG tablet, Take 10 mg by mouth at bedtime. , Disp: , Rfl:   Social History   Tobacco Use  Smoking Status Never Smoker  Smokeless Tobacco Never Used    Allergies  Allergen Reactions  . Benzalkonium Chloride     unknown  . Hydrocodone-Acetaminophen     unknown  . Piroxicam     unknown  . Celandine [Chelidonium Majus] Nausea And Vomiting  . Celebrex [Celecoxib] Nausea And Vomiting  . Hydrocodone Other (See Comments)    sick  . Neosporin [Neomycin-Bacitracin Zn-Polymyx] Rash  . Other Rash and Other (See Comments)    Icy Hot Patches  . Oxycodone Nausea And Vomiting   Objective:  There were no vitals filed for this visit. There is no height or weight on file to calculate BMI. Constitutional Well developed. Well nourished.  Vascular Dorsalis pedis pulses palpable bilaterally. Posterior tibial pulses palpable bilaterally. Capillary refill normal to all digits.  No cyanosis or clubbing noted. Pedal hair growth normal.  Neurologic Normal speech. Oriented to person, place, and time. Epicritic sensation to light touch grossly present bilaterally.  Dermatologic Nails well groomed and normal in appearance. No open wounds. No skin lesions.  Orthopedic: Normal joint ROM without pain or  crepitus bilaterally. No visible deformities. Slight POP R 2nd MPJ. No warmth or erythema.   Radiographs: None today. Assessment:   1. Freiberg's disease, right   2. Capsulitis   3. Chronic foot pain, right    Plan:  Patient was evaluated and treated and all questions answered.  Freiberg infarction -Improving. -Pads dispensed, educated on use. -D/c CAM boot. -Should pain persist will get new XR at next visit.  Return in about 6 weeks (around 01/31/2018) for Freiberg's f/u.

## 2017-12-23 ENCOUNTER — Telehealth: Payer: Self-pay | Admitting: Neurology

## 2017-12-23 MED ORDER — VENLAFAXINE HCL ER 37.5 MG PO CP24: ORAL_CAPSULE | ORAL | 3 refills | Status: DC

## 2017-12-23 NOTE — Telephone Encounter (Signed)
I called the patient.  The patient still having headaches after the increase in the Topamax dosing.  She is also on gabapentin and Tenormin, she has not been tried on any other medications recently.  I will give her a trial on Effexor, if this is not effective, she is to contact our office.

## 2017-12-23 NOTE — Telephone Encounter (Signed)
Pt has called wanting Dr Jannifer Franklin to be aware that her migraines have come back. Pt states since last night she has been having a migraine. Pt states if something is called in for her please send to  [place], [place] [phone] (Phone) [phone] (Fax)    Please call

## 2017-12-23 NOTE — Addendum Note (Signed)
Addended by: Kathrynn Ducking on: 12/23/2017 05:26 PM   Modules accepted: Orders

## 2018-01-10 ENCOUNTER — Other Ambulatory Visit: Payer: Self-pay | Admitting: Neurology

## 2018-01-10 NOTE — Telephone Encounter (Signed)
Last filled on 10/12/17 for #180. Next OV is 02/28/2018.

## 2018-01-26 ENCOUNTER — Other Ambulatory Visit: Payer: Self-pay | Admitting: Neurology

## 2018-01-26 ENCOUNTER — Telehealth: Payer: Self-pay | Admitting: Neurology

## 2018-01-26 MED ORDER — ERENUMAB-AOOE 140 MG/ML ~~LOC~~ SOAJ: 140.0000 mg | SUBCUTANEOUS | 3 refills | Status: DC

## 2018-01-26 NOTE — Telephone Encounter (Signed)
I called patient, left a message.  The patient feels that the Effexor has affected her vision, she will stop the medication.  The patient will be seen for an evaluation in November, we may consider addition of a medication such as Aimovig or Ajovy at that time.

## 2018-01-26 NOTE — Telephone Encounter (Signed)
Pt states after getting her refill on the venlafaxine XR (EFFEXOR XR) 37.5 MG 24 hr capsule she was reading about side effects and noticed this has been affecting her vision drastically.  Pt states she no longer will take this medication, she is asking for a call to discuss

## 2018-01-26 NOTE — Addendum Note (Signed)
Addended by: Kathrynn Ducking on: 01/26/2018 05:21 PM   Modules accepted: Orders

## 2018-01-26 NOTE — Telephone Encounter (Signed)
I called the patient.  The Effexor caused her to have poor night vision, this improved when she came off the medication.  We will give a trial on Aimovig to see if this is tolerated and helps the headaches.

## 2018-01-26 NOTE — Telephone Encounter (Signed)
Patient is returning your call and has questions.

## 2018-01-27 ENCOUNTER — Telehealth: Payer: Self-pay | Admitting: *Deleted

## 2018-01-27 NOTE — Telephone Encounter (Signed)
Completed PA Aimovig 140mg /ml on covermymeds. Key: [id number]. Waiting on determination.

## 2018-01-27 NOTE — Telephone Encounter (Signed)
Pa approved effective from 01/27/2018 through 01/28/2019.

## 2018-01-31 ENCOUNTER — Ambulatory Visit: Payer: Medicare Other | Admitting: Podiatry

## 2018-01-31 NOTE — Telephone Encounter (Signed)
I called the patient.  The patient is unable to afford the co-pay for her medication.  I will send her some information regarding a foundation for Aimovig that could potentially help her with her co-pay requirements.

## 2018-01-31 NOTE — Telephone Encounter (Signed)
Pt called stating she cannot afford medication Aimovig stating it would cost her 175 dollars a month. Please advise

## 2018-01-31 NOTE — Telephone Encounter (Signed)
Spoke with Frances Robertson and explained the $175 is a copay for a 3 mo. rx., so the cost per month is just over $58. She sts. this is still not affordable for her. Will let Dr. Jannifer Franklin know she will not be starting it/fim

## 2018-02-02 ENCOUNTER — Telehealth: Payer: Self-pay | Admitting: *Deleted

## 2018-02-02 NOTE — Telephone Encounter (Signed)
Request for tier exception for Aimovig 140mg  SQ q30 days completed and faxed to [facility] [place], fax# [phone]

## 2018-02-04 NOTE — Telephone Encounter (Signed)
LMOM for pt. that the tier exception for Aimovig as been denied. She should complete pt. assistance process as already discussed.  Please call back with any questions/fim

## 2018-02-04 NOTE — Telephone Encounter (Signed)
Daryl@ [employer] has called from the authorization department to inform the request has been denied because there is nothing else to compare the brand name Aimovig to.  There would not be anything to lower the Aimovig to, Daryl's office # is [phone] option [id number]

## 2018-02-08 ENCOUNTER — Telehealth: Payer: Self-pay | Admitting: *Deleted

## 2018-02-08 MED ORDER — DIVALPROEX SODIUM ER 500 MG PO TB24: 500.0000 mg | ORAL_TABLET | Freq: Every day | ORAL | 2 refills | Status: DC

## 2018-02-08 NOTE — Addendum Note (Signed)
Addended by: Kathrynn Ducking on: 02/08/2018 03:26 PM   Modules accepted: Orders

## 2018-02-08 NOTE — Telephone Encounter (Signed)
Pt. returned completed [employer] forms.  Physician portion has been completed. Forms faxed to [employer], fax# [phone], with fax confirmation received/fim

## 2018-02-08 NOTE — Telephone Encounter (Signed)
I called the patient.  The patient is having ongoing headaches, we are trying to get her approved for the Aimovig and get patient assistance.  I will call in a prescription for Depakote ER taking 1 500 mg tablet daily.

## 2018-02-08 NOTE — Telephone Encounter (Signed)
Pt requesting a call stating she would like Dr. Jannifer Franklin to call in a medication to help maintain her h/a until Aimovig is approved. Please advise

## 2018-02-18 NOTE — Progress Notes (Signed)
[facility]  PATIENT: Frances Robertson DOB: Jul 26, 1940   REASON FOR VISIT: Follow-up for migraines , history of low back pain HISTORY FROM: Patient    HISTORY OF PRESENT ILLNESS:UPDATE 11/18/2019CM Frances Robertson, 77 year old female returns for follow-up with history of headaches which are chronic.  She is currently on Topamax 150 mg at bedtime, Depakote 500 mg at bedtime and gabapentin 302 capsules 3 times daily.  She takes Ultram acutely.  She has been placed on Aimovig however her PA has just been approved and she has not received the medication yet.  She does not wish to increase any of her other medications that she is currently on for headache at this time.  She is hoping that the Baker is going to be very beneficial.  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  She sleeps well at night.  She returns for reevaluation 02/24/17 KWMs. Frances Robertson is a 77 year old right-handed white female with a history of chronic migraine headache.  The patient no longer has frequent severe headaches, she has gained benefit with Topamax taking 100 mg at night.  She may have 1 or 2 severe headaches a year.  The patient however does have chronic daily headaches that are much more mild, and seem to respond to gabapentin, but the headaches may return prior to each dosing timeframe.  The patient has back pain and bilateral lower extremity discomfort, she takes Ultram frequently throughout the day, 1 tablet every 4 hours.  The patient is sleeping fairly well at night.  She continues to work.  The patient overall is satisfied with her ability to function during the day.  She returns to the office for further evaluation.  The patient also reports ice pick pains that may occur intermittently on the head.   02/15/16 CMMs. Frances Robertson is a 77 year old right-handed white female with a history of migraine headaches. The patient indicates that her headaches are doing relatively well, she is having 1 or 2  headaches a month that are severe. The patient is able to control the headaches within several hours. She had right lumbar micro-diskectomy in April 2016 and she no longer has significant issues with back pain.  She indicates that her headaches are bifrontal in nature, and they usually last 2 or 3 hours when they do occur. The patient is on gabapentin for the back and for the headache, and she takes Topamax 100 mg at night. Patient also reports  sinus headaches. She self medicates for this. She denies side effects to her medications. She fell and broke her wrist in February of this year. She states she has osteoporosis but cannot afford the medication. She takes calcium and vitamin D. 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 She returns for reevaluation.   REVIEW OF SYSTEMS: Full 14 system review of systems performed and notable only for those listed, all others are neg:  Constitutional: neg  Cardiovascular: neg Ear/Nose/Throat: neg  Skin: neg Eyes: neg Respiratory: neg Gastroitestinal: neg  Hematology/Lymphatic: neg  Endocrine: neg Musculoskeletal: Joint pain, arthritis Allergy/Immunology: neg Neurological: Headache Psychiatric: neg Sleep : Insomnia ALLERGIES: Allergies  Allergen Reactions  . Benzalkonium Chloride     unknown  . Hydrocodone-Acetaminophen     unknown  . Piroxicam     unknown  . Celandine [Chelidonium Majus] Nausea And Vomiting  . Celebrex [Celecoxib] Nausea And Vomiting  . Hydrocodone Other (See Comments)    sick  . Neosporin [Neomycin-Bacitracin Zn-Polymyx] Rash  . Other Rash and Other (  See Comments)    Icy Hot Patches  . Oxycodone Nausea And Vomiting    HOME MEDICATIONS: Outpatient Medications Prior to Visit  Medication Sig Dispense Refill  . atenolol (TENORMIN) 50 MG tablet Take 50 mg by mouth daily.    Marland Kitchen dexamethasone (DECADRON) 2 MG tablet Take 3 tablets the first day, 2 the second and 1 the third day 6 tablet 0  . divalproex (DEPAKOTE ER) 500 MG 24 hr  tablet Take 1 tablet (500 mg total) by mouth at bedtime. 30 tablet 2  . gabapentin (NEURONTIN) 300 MG capsule Take 2 capsules (600 mg total) by mouth 3 (three) times daily. 180 capsule 5  . levothyroxine (SYNTHROID, LEVOTHROID) 100 MCG tablet Take 100 mcg by mouth daily before breakfast.    . meloxicam (MOBIC) 7.5 MG tablet Take 1 tablet (7.5 mg total) by mouth daily. 30 tablet 0  . omeprazole (PRILOSEC) 40 MG capsule Take 40 mg by mouth daily.    Marland Kitchen topiramate (TOPAMAX) 50 MG tablet Take 3 tablets (150 mg total) by mouth at bedtime. 270 tablet 1  . traMADol (ULTRAM) 50 MG tablet TAKE 1 TABLET BY MOUTH EVERY 6 HOURS AS NEEDED; MUST LAST 60 DAYS 180 tablet 1  . zolpidem (AMBIEN) 10 MG tablet Take 10 mg by mouth at bedtime.     Eduard Roux (AIMOVIG) 140 MG/ML SOAJ Inject 140 mg into the skin every 30 (thirty) days. (Patient not taking: Reported on 02/28/2018) 1 pen 3   No facility-administered medications prior to visit.     PAST MEDICAL HISTORY: Past Medical History:  Diagnosis Date  . Anemia, unspecified   . Arthritis   . GERD (gastroesophageal reflux disease)    takes Omeprazole daily  . Headache(784.0)    takes Topamax daily and Atenolol daily;takes Tramadol daily as well  . Insomnia    takes Ambien nightly  . Joint pain   . Joint swelling   . Low back pain    herniated disc  . PAC (premature atrial contraction)   . Pneumonia hx of-as a child  . PONV (postoperative nausea and vomiting)   . Tinnitus   . Unspecified hypothyroidism    takes Synthroid daily    PAST SURGICAL HISTORY: Past Surgical History:  Procedure Laterality Date  . CARPAL TUNNEL RELEASE Bilateral   . CATARACT EXTRACTION Bilateral   . CHOLECYSTECTOMY    . ESOPHAGOGASTRODUODENOSCOPY    . LUMBAR LAMINECTOMY/DECOMPRESSION MICRODISCECTOMY Right 07/23/2014   Procedure: Right Lumbar four-five microdiskectomy;  Surgeon: Newman Pies, MD;  Location: [facility];  Service: Neurosurgery;  Laterality: Right;   Right Lumbar four-five microdiskectomy  . TUBAL LIGATION Bilateral     FAMILY HISTORY: Family History  Problem Relation Age of Onset  . Venous thrombosis Mother 29       vague history of this vs aneurysm somewhere  . Stroke Father 7       Sisters x 2 with breast cancer, brothers x 2 died with lung cancer  . Cancer Other   . Cancer - Lung Brother   . Cancer - Lung Brother   . Sudden death Son 63    SOCIAL HISTORY: Social History   Socioeconomic History  . Marital status: Widowed    Spouse name: Not on file  . Number of children: 2  . Years of education: 9  . Highest education level: Not on file  Occupational History  . Occupation: Nurse, children's    Employer: [employer].    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    Social Needs  . Financial resource strain: Not on file  . Food insecurity:    Worry: Not on file    Inability: Not on file  . Transportation needs:    Medical: Not on file    Non-medical: Not on file  Tobacco Use  . Smoking status: Never Smoker  . Smokeless tobacco: Never Used  Substance and Sexual Activity  . Alcohol use: No  . Drug use: No  . Sexual activity: Not on file  Lifestyle  . Physical activity:    Days per week: Not on file    Minutes per session: Not on file  . Stress: Not on file  Relationships  . Social connections:    Talks on phone: Not on file    Gets together: Not on file    Attends religious service: Not on file    Active member of club or organization: Not on file    Attends meetings of clubs or organizations: Not on file    Relationship status: Not on file  . Intimate partner violence:    Fear of current or ex partner: Not on file    Emotionally abused: Not on file    Physically abused: Not on file    Forced sexual activity: Not on file  Other Topics Concern  . Not on file  Social History Narrative   Lives alone.    Patient is right handed.   Patient does not drink caffeine.     PHYSICAL  EXAM  Vitals:   02/28/18 0729  BP: (!) 179/92  Pulse: 83  Weight: 147 lb (66.7 kg)  Height: 5\' 2"  (1.575 m)   Body mass index is 26.89 kg/m. General: The patient is alert and cooperative at the time of the examination. Skin: No significant peripheral edema is noted. Neurologic Exam Mental status: The patient is alert and oriented x 3 at the time of the examination. The patient has apparent normal recent and remote memory, with an apparently normal attention span and concentration ability. Cranial nerves: Facial symmetry is present. Speech is normal, no aphasia or dysarthria is noted. Extraocular movements are full. Visual fields are full. Motor: The patient has good strength in all 4 extremities. Sensory examination: Soft touch, is  symmetric in the upper and lower extremities Coordination: The patient has good finger-nose-finger and heel-to-shin bilaterally. Gait and station: The patient has a normal gait. Tandem gait is mildly unsteady. Romberg is negative. No drift is seen. Reflexes: Deep tendon reflexes are symmetric. DIAGNOSTIC DATA (LABS, IMAGING, TESTING) - I reviewed patient records, labs, notes, testing and imaging myself where available.  Lab Results  Component Value Date   WBC 4.8 07/12/2014   HGB 11.0 (L) 07/12/2014   HCT 34.4 (L) 07/12/2014   MCV 88.7 07/12/2014   PLT 168 07/12/2014      Component Value Date/Time   NA 140 07/12/2014 1126   K 4.5 07/12/2014 1126   CL 107 07/12/2014 1126   CO2 25 07/12/2014 1126   GLUCOSE 105 (H) 07/12/2014 1126   BUN 16 07/12/2014 1126   CREATININE 0.70 07/12/2014 1126   CALCIUM 9.5 07/12/2014 1126   GFRNONAA 83 (L) 07/12/2014 1126   GFRAA >90 07/12/2014 1126    ASSESSMENT AND PLAN  77 y.o. year old female  has a past medical history of  Headache(784.0); and migraine here to follow-up.  Her PA for) Aimovig has just been approved.  She has not received the medication  yet.  She does not wish to increase her preventive  medications further at this time.  Continue Topamax at current dose Patient will start Aimovig as soon as it is shipped to her, PA has been done  Continue Divalproex 500mg  at bedtime Continue Gabapentin 300mg  2 caps three times daily Continue tramadol when necessary only F/U in 3 to 4 months Dennie Bible, [facility], [employer] [facility] [address], [place], [place] [address] [phone]

## 2018-02-28 ENCOUNTER — Ambulatory Visit: Payer: PPO | Admitting: Nurse Practitioner

## 2018-02-28 ENCOUNTER — Encounter: Payer: Self-pay | Admitting: Nurse Practitioner

## 2018-02-28 VITALS — BP 179/92 | HR 83 | Ht 62.0 in | Wt 147.0 lb

## 2018-02-28 DIAGNOSIS — M5440 Lumbago with sciatica, unspecified side: Secondary | ICD-10-CM | POA: Diagnosis not present

## 2018-02-28 DIAGNOSIS — G8929 Other chronic pain: Secondary | ICD-10-CM

## 2018-02-28 DIAGNOSIS — G43019 Migraine without aura, intractable, without status migrainosus: Secondary | ICD-10-CM | POA: Diagnosis not present

## 2018-02-28 NOTE — Patient Instructions (Signed)
Continue Topamax at current dose Continue Divalproex 500mg  at bedtime Continue Gabapentin 300mg  2 caps three times daily Continue tramadol when necessary only F/U in 3 to 4 months

## 2018-02-28 NOTE — Progress Notes (Signed)
I have read the note, and I agree with the clinical assessment and plan.  Charles K Willis   

## 2018-03-28 ENCOUNTER — Ambulatory Visit (INDEPENDENT_AMBULATORY_CARE_PROVIDER_SITE_OTHER): Payer: Medicare Other

## 2018-03-28 ENCOUNTER — Encounter: Payer: Self-pay | Admitting: *Deleted

## 2018-03-28 ENCOUNTER — Ambulatory Visit: Payer: Medicare Other | Admitting: Podiatry

## 2018-03-28 DIAGNOSIS — M79671 Pain in right foot: Secondary | ICD-10-CM

## 2018-03-28 DIAGNOSIS — M9271 Juvenile osteochondrosis of metatarsus, right foot: Secondary | ICD-10-CM | POA: Diagnosis not present

## 2018-03-28 DIAGNOSIS — M7751 Other enthesopathy of right foot: Secondary | ICD-10-CM

## 2018-03-28 DIAGNOSIS — G8929 Other chronic pain: Secondary | ICD-10-CM | POA: Diagnosis not present

## 2018-03-28 NOTE — Progress Notes (Signed)
Subjective:  Patient ID: Frances Robertson, female    DOB: [birth year],  MRN: [id number]  Chief Complaint  Patient presents with  . freiberg's disease    F/U R foot Pt. states," 3 wks ago it started getting sore again ( 4/10) and red and it looks swollen to me." Tx: padding     77 y.o. female presents with the above complaint.   Review of Systems: Negative except as noted in the HPI. Denies N/V/F/Ch.  Past Medical History:  Diagnosis Date  . Anemia, unspecified   . Arthritis   . GERD (gastroesophageal reflux disease)    takes Omeprazole daily  . Headache(784.0)    takes Topamax daily and Atenolol daily;takes Tramadol daily as well  . Insomnia    takes Ambien nightly  . Joint pain   . Joint swelling   . Low back pain    herniated disc  . PAC (premature atrial contraction)   . Pneumonia hx of-as a child  . PONV (postoperative nausea and vomiting)   . Tinnitus   . Unspecified hypothyroidism    takes Synthroid daily    Current Outpatient Medications:  .  atenolol (TENORMIN) 50 MG tablet, Take 50 mg by mouth daily., Disp: , Rfl:  .  dexamethasone (DECADRON) 2 MG tablet, Take 3 tablets the first day, 2 the second and 1 the third day, Disp: 6 tablet, Rfl: 0 .  divalproex (DEPAKOTE ER) 500 MG 24 hr tablet, Take 1 tablet (500 mg total) by mouth at bedtime., Disp: 30 tablet, Rfl: 2 .  Erenumab-aooe (AIMOVIG) 140 MG/ML SOAJ, Inject 140 mg into the skin every 30 (thirty) days., Disp: 1 pen, Rfl: 3 .  gabapentin (NEURONTIN) 300 MG capsule, Take 2 capsules (600 mg total) by mouth 3 (three) times daily., Disp: 180 capsule, Rfl: 5 .  levothyroxine (SYNTHROID, LEVOTHROID) 100 MCG tablet, Take 100 mcg by mouth daily before breakfast., Disp: , Rfl:  .  meloxicam (MOBIC) 7.5 MG tablet, Take 1 tablet (7.5 mg total) by mouth daily., Disp: 30 tablet, Rfl: 0 .  omeprazole (PRILOSEC) 40 MG capsule, Take 40 mg by mouth daily., Disp: , Rfl:  .  topiramate (TOPAMAX) 50 MG tablet, Take 3 tablets (150 mg  total) by mouth at bedtime., Disp: 270 tablet, Rfl: 1 .  traMADol (ULTRAM) 50 MG tablet, TAKE 1 TABLET BY MOUTH EVERY 6 HOURS AS NEEDED; MUST LAST 60 DAYS, Disp: 180 tablet, Rfl: 1 .  zolpidem (AMBIEN) 10 MG tablet, Take 10 mg by mouth at bedtime. , Disp: , Rfl:   Social History   Tobacco Use  Smoking Status Never Smoker  Smokeless Tobacco Never Used    Allergies  Allergen Reactions  . Benzalkonium Chloride     unknown  . Hydrocodone-Acetaminophen     unknown  . Piroxicam     unknown  . Celandine [Chelidonium Majus] Nausea And Vomiting  . Celebrex [Celecoxib] Nausea And Vomiting  . Hydrocodone Other (See Comments)    sick  . Neosporin [Neomycin-Bacitracin Zn-Polymyx] Rash  . Other Rash and Other (See Comments)    Icy Hot Patches  . Oxycodone Nausea And Vomiting   Objective:  There were no vitals filed for this visit. There is no height or weight on file to calculate BMI. Constitutional Well developed. Well nourished.  Vascular Dorsalis pedis pulses palpable bilaterally. Posterior tibial pulses palpable bilaterally. Capillary refill normal to all digits.  No cyanosis or clubbing noted. Pedal hair growth normal.  Neurologic Normal speech. Oriented to person,  place, and time. Epicritic sensation to light touch grossly present bilaterally.  Dermatologic Nails well groomed and normal in appearance. No open wounds. No skin lesions.  Orthopedic: Normal joint ROM without pain or crepitus bilaterally. No visible deformities. +POP R 2nd MPJ with local warmth    Radiographs: Taken and reviewed progressive metatarsal head flattening. Assessment:   1. Freiberg's disease, right   2. Capsulitis of metatarsophalangeal (MTP) joint of right foot   3. Chronic foot pain, right    Plan:  Patient was evaluated and treated and all questions answered.  Freiberg infarction -XR reviewed progressive flattening of the 2nd metatarsal head. -Resume CAM Boot  Return in about 4 weeks  (around 04/25/2018) for [place] infarction f/u.

## 2018-04-04 ENCOUNTER — Other Ambulatory Visit: Payer: Self-pay | Admitting: Podiatry

## 2018-04-04 DIAGNOSIS — M9271 Juvenile osteochondrosis of metatarsus, right foot: Secondary | ICD-10-CM

## 2018-04-04 DIAGNOSIS — M7751 Other enthesopathy of right foot: Secondary | ICD-10-CM

## 2018-04-25 ENCOUNTER — Ambulatory Visit (INDEPENDENT_AMBULATORY_CARE_PROVIDER_SITE_OTHER): Payer: Medicare Other | Admitting: Podiatry

## 2018-04-25 ENCOUNTER — Ambulatory Visit (INDEPENDENT_AMBULATORY_CARE_PROVIDER_SITE_OTHER): Payer: Medicare Other

## 2018-04-25 DIAGNOSIS — Q828 Other specified congenital malformations of skin: Secondary | ICD-10-CM

## 2018-04-25 DIAGNOSIS — M7751 Other enthesopathy of right foot: Secondary | ICD-10-CM

## 2018-04-25 DIAGNOSIS — M779 Enthesopathy, unspecified: Secondary | ICD-10-CM

## 2018-04-25 DIAGNOSIS — M79671 Pain in right foot: Secondary | ICD-10-CM

## 2018-04-25 DIAGNOSIS — M9271 Juvenile osteochondrosis of metatarsus, right foot: Secondary | ICD-10-CM

## 2018-04-25 DIAGNOSIS — G8929 Other chronic pain: Secondary | ICD-10-CM

## 2018-04-25 NOTE — Progress Notes (Signed)
Subjective:  Patient ID: Frances Robertson, female    DOB: [birth year],  MRN: [id number]  Chief Complaint  Patient presents with  . Freiberg's Disease    F/U R foot Pt. states," I think it's better, a lot better, there's not much pain on it now; 2/10." Tx; cam boot -pt deneis redness/swelling    78 y.o. female presents with the above complaint. Wearing normal shoes without pain.  New complaint of painful lesion and bony protuberance outside of the left foot . States this did not happen because of her new shoes and pre-dated this. Chronic in nature.  Review of Systems: Negative except as noted in the HPI. Denies N/V/F/Ch.  Past Medical History:  Diagnosis Date  . Anemia, unspecified   . Arthritis   . GERD (gastroesophageal reflux disease)    takes Omeprazole daily  . Headache(784.0)    takes Topamax daily and Atenolol daily;takes Tramadol daily as well  . Insomnia    takes Ambien nightly  . Joint pain   . Joint swelling   . Low back pain    herniated disc  . PAC (premature atrial contraction)   . Pneumonia hx of-as a child  . PONV (postoperative nausea and vomiting)   . Tinnitus   . Unspecified hypothyroidism    takes Synthroid daily    Current Outpatient Medications:  .  atenolol (TENORMIN) 50 MG tablet, Take 50 mg by mouth daily., Disp: , Rfl:  .  dexamethasone (DECADRON) 2 MG tablet, Take 3 tablets the first day, 2 the second and 1 the third day, Disp: 6 tablet, Rfl: 0 .  divalproex (DEPAKOTE ER) 500 MG 24 hr tablet, Take 1 tablet (500 mg total) by mouth at bedtime., Disp: 30 tablet, Rfl: 2 .  Erenumab-aooe (AIMOVIG) 140 MG/ML SOAJ, Inject 140 mg into the skin every 30 (thirty) days., Disp: 1 pen, Rfl: 3 .  gabapentin (NEURONTIN) 300 MG capsule, Take 2 capsules (600 mg total) by mouth 3 (three) times daily., Disp: 180 capsule, Rfl: 5 .  levothyroxine (SYNTHROID, LEVOTHROID) 100 MCG tablet, Take 100 mcg by mouth daily before breakfast., Disp: , Rfl:  .  meloxicam (MOBIC) 7.5  MG tablet, Take 1 tablet (7.5 mg total) by mouth daily., Disp: 30 tablet, Rfl: 0 .  omeprazole (PRILOSEC) 40 MG capsule, Take 40 mg by mouth daily., Disp: , Rfl:  .  topiramate (TOPAMAX) 50 MG tablet, Take 3 tablets (150 mg total) by mouth at bedtime., Disp: 270 tablet, Rfl: 1 .  traMADol (ULTRAM) 50 MG tablet, TAKE 1 TABLET BY MOUTH EVERY 6 HOURS AS NEEDED; MUST LAST 60 DAYS, Disp: 180 tablet, Rfl: 1 .  zolpidem (AMBIEN) 10 MG tablet, Take 10 mg by mouth at bedtime. , Disp: , Rfl:   Social History   Tobacco Use  Smoking Status Never Smoker  Smokeless Tobacco Never Used    Allergies  Allergen Reactions  . Benzalkonium Chloride     unknown  . Hydrocodone-Acetaminophen     unknown  . Piroxicam     unknown  . Celandine [Chelidonium Majus] Nausea And Vomiting  . Celebrex [Celecoxib] Nausea And Vomiting  . Hydrocodone Other (See Comments)    sick  . Neosporin [Neomycin-Bacitracin Zn-Polymyx] Rash  . Other Rash and Other (See Comments)    Icy Hot Patches  . Oxycodone Nausea And Vomiting   Objective:  There were no vitals filed for this visit. There is no height or weight on file to calculate BMI. Constitutional Well developed. Well nourished.  Vascular Dorsalis pedis pulses palpable bilaterally. Posterior tibial pulses palpable bilaterally. Capillary refill normal to all digits.  No cyanosis or clubbing noted. Pedal hair growth normal.  Neurologic Normal speech. Oriented to person, place, and time. Epicritic sensation to light touch grossly present bilaterally.  Dermatologic Nails well groomed and normal in appearance. No open wounds. Punctate keratosis 5th met base.  Orthopedic: Normal joint ROM without pain or crepitus bilaterally. No visible deformities. + slight POP R 2nd MPJ, no local warmth  POP R 5th met base   Radiographs: Taken and reviewed progressive metatarsal head flattening. Assessment:   1. Freiberg's disease, right   2. Capsulitis of  metatarsophalangeal (MTP) joint of right foot   3. Chronic foot pain, right   4. Capsulitis   5. Enthesitis   6. Porokeratosis    Plan:  Patient was evaluated and treated and all questions answered.  Freiberg infarction -XR reviewed no fracture continued flattening -Minimal pain on exam -Continue WBAT in normal shoegear  Enthesitis R 5th Met base -Injection delivered 5th met base right / PB insertion. -Would consider exostectomy in the future if pain persists.  No follow-ups on file.

## 2018-04-27 ENCOUNTER — Telehealth: Payer: Self-pay | Admitting: Nurse Practitioner

## 2018-04-27 NOTE — Telephone Encounter (Signed)
Pt has called to inform she is no longer taking divalproex (DEPAKOTE ER) 500 MG 24 hr tablet due to it causing noticeable hair loss.  Pt said she has not heard anything more BV:APOLIDCV-UDTH (AIMOVIG) 140 MG/ML SOAJ but would like to know status, please call

## 2018-04-27 NOTE — Telephone Encounter (Signed)
I called pharmacy.  Aimovig thru insurance will be $226.39 per injection.  [employer] safety net # was given by [facility]/RN for pt ot f/u on for financial help.

## 2018-04-27 NOTE — Telephone Encounter (Signed)
According to the information in the computer about the PA for Aimovig from [place], [place] approved effective from 01/27/2018 through 01/28/2019.  This is from the [facility] in [place].  Patient needs to pick up this prescription and take the medication as directed

## 2018-04-27 NOTE — Telephone Encounter (Addendum)
Called patient and asked when she stopped Depakote. She stopped on 04/14/18 due to thinning hair. She also voiced concern over it possibly causing vision issues. She did not notify this office that she had stopped. I advised she shouldn't have stopped it without calling first.  I then advised that she call [facility] at   [phone], Monday - Friday 8am to 8pm ET to check on status of Aimovig application that was faxed to them on 02/08/18. She wrote down phone number, stated she would call.  She then asked what would be done about her headache; she's had one since yesterday. She is taking topiramate, gabapentin as prescribed, has taken tramadol.  I advised will send to NP and call her with reply. She verbalized understanding, appreciation.

## 2018-05-19 ENCOUNTER — Other Ambulatory Visit: Payer: Self-pay | Admitting: Neurology

## 2018-05-30 ENCOUNTER — Other Ambulatory Visit: Payer: Self-pay | Admitting: Neurology

## 2018-06-27 ENCOUNTER — Ambulatory Visit: Payer: Medicare Other | Admitting: Podiatry

## 2018-07-04 ENCOUNTER — Telehealth: Payer: Self-pay | Admitting: Podiatry

## 2018-07-04 NOTE — Telephone Encounter (Signed)
Due to Covid-19 I called patient to see how she was doing. She is having pain from where the area on the outside of her foot is. I discussed at this point we can try some pads to off-load the area. She will probably need surgery but all elective surgery is on hold at this time. We will have her f/u in 4 weeks for recheck.

## 2018-07-04 NOTE — Telephone Encounter (Signed)
Called patient, no answer. She called back and spoke with patient. See separate note.

## 2018-07-05 ENCOUNTER — Ambulatory Visit: Payer: Medicare Other | Admitting: Podiatry

## 2018-07-05 ENCOUNTER — Other Ambulatory Visit: Payer: Self-pay | Admitting: Neurology

## 2018-07-19 ENCOUNTER — Other Ambulatory Visit: Payer: Self-pay

## 2018-07-19 ENCOUNTER — Encounter: Payer: Self-pay | Admitting: Podiatry

## 2018-07-19 ENCOUNTER — Ambulatory Visit: Payer: Medicare Other | Admitting: Podiatry

## 2018-07-19 ENCOUNTER — Ambulatory Visit (INDEPENDENT_AMBULATORY_CARE_PROVIDER_SITE_OTHER): Payer: Medicare Other

## 2018-07-19 VITALS — BP 165/93 | HR 87 | Temp 97.8°F | Resp 16

## 2018-07-19 DIAGNOSIS — M9271 Juvenile osteochondrosis of metatarsus, right foot: Secondary | ICD-10-CM

## 2018-07-19 DIAGNOSIS — M779 Enthesopathy, unspecified: Secondary | ICD-10-CM | POA: Diagnosis not present

## 2018-07-19 DIAGNOSIS — M79671 Pain in right foot: Secondary | ICD-10-CM

## 2018-07-19 DIAGNOSIS — M7751 Other enthesopathy of right foot: Secondary | ICD-10-CM

## 2018-07-19 DIAGNOSIS — G8929 Other chronic pain: Secondary | ICD-10-CM

## 2018-07-20 NOTE — Progress Notes (Signed)
Subjective:  Patient ID: Frances Robertson, female    DOB: October 11, 1940,  MRN: [id number]  Chief Complaint  Patient presents with  . capsulitis    F/U Rt 5th capsulitis and freiberg's diseas PT.s tates," 4/5 wks been dealing with mroe pain in the outer side of my riht foot."; 5/10 shapr constant pain -worse barefooted Tx: none -pt denies N/V/F/Ch/swelling    78 y.o. female presents with the above complaint. Hx as above. Pain in the arch and on the outside of the foot.  Review of Systems: Negative except as noted in the HPI. Denies N/V/F/Ch.  Past Medical History:  Diagnosis Date  . Anemia, unspecified   . Arthritis   . GERD (gastroesophageal reflux disease)    takes Omeprazole daily  . Headache(784.0)    takes Topamax daily and Atenolol daily;takes Tramadol daily as well  . Insomnia    takes Ambien nightly  . Joint pain   . Joint swelling   . Low back pain    herniated disc  . PAC (premature atrial contraction)   . Pneumonia hx of-as a child  . PONV (postoperative nausea and vomiting)   . Tinnitus   . Unspecified hypothyroidism    takes Synthroid daily    Current Outpatient Medications:  .  atenolol (TENORMIN) 50 MG tablet, Take 50 mg by mouth daily., Disp: , Rfl:  .  dexamethasone (DECADRON) 2 MG tablet, Take 3 tablets the first day, 2 the second and 1 the third day, Disp: 6 tablet, Rfl: 0 .  divalproex (DEPAKOTE ER) 500 MG 24 hr tablet, Take 1 tablet (500 mg total) by mouth at bedtime., Disp: 30 tablet, Rfl: 2 .  Erenumab-aooe (AIMOVIG) 140 MG/ML SOAJ, Inject 140 mg into the skin every 30 (thirty) days., Disp: 1 pen, Rfl: 3 .  esomeprazole (NEXIUM) 40 MG capsule, TAKE 1 CAPSULE BY MOUTH ONCE DAILY FOR 30 DAYS, Disp: , Rfl:  .  gabapentin (NEURONTIN) 300 MG capsule, TAKE 2 CAPSULES BY MOUTH THREE TIMES DAILY, Disp: 180 capsule, Rfl: 4 .  levothyroxine (SYNTHROID, LEVOTHROID) 100 MCG tablet, Take 100 mcg by mouth daily before breakfast., Disp: , Rfl:  .  meloxicam (MOBIC) 7.5  MG tablet, Take 1 tablet (7.5 mg total) by mouth daily., Disp: 30 tablet, Rfl: 0 .  omeprazole (PRILOSEC) 40 MG capsule, Take 40 mg by mouth daily., Disp: , Rfl:  .  topiramate (TOPAMAX) 50 MG tablet, Take 3 tablets (150 mg total) by mouth at bedtime. Please call [phone] to schedule an appt., Disp: 270 tablet, Rfl: 0 .  traMADol (ULTRAM) 50 MG tablet, TAKE 1 TABLET BY MOUTH EVERY 6 HOURS AS NEEDED MUST  LAST  60  DAYS, Disp: 180 tablet, Rfl: 0 .  zolpidem (AMBIEN) 10 MG tablet, Take 10 mg by mouth at bedtime. , Disp: , Rfl:   Social History   Tobacco Use  Smoking Status Never Smoker  Smokeless Tobacco Never Used    Allergies  Allergen Reactions  . Benzalkonium Chloride     unknown  . Hydrocodone-Acetaminophen     unknown  . Piroxicam     unknown  . Celandine [Chelidonium Majus] Nausea And Vomiting  . Celebrex [Celecoxib] Nausea And Vomiting  . Hydrocodone Other (See Comments)    sick  . Neosporin [Neomycin-Bacitracin Zn-Polymyx] Rash  . Other Rash and Other (See Comments)    Icy Hot Patches  . Oxycodone Nausea And Vomiting   Objective:   Vitals:   07/19/18 1348  BP: (!) 165/93  Pulse: 87  Resp: 16  Temp: 97.8 F (36.6 C)   There is no height or weight on file to calculate BMI. Constitutional Well developed. Well nourished.  Vascular Dorsalis pedis pulses palpable bilaterally. Posterior tibial pulses palpable bilaterally. Capillary refill normal to all digits.  No cyanosis or clubbing noted. Pedal hair growth normal.  Neurologic Normal speech. Oriented to person, place, and time. Epicritic sensation to light touch grossly present bilaterally.  Dermatologic Nails well groomed and normal in appearance. No open wounds. Punctate keratosis 5th met base.  Orthopedic: Normal joint ROM without pain or crepitus bilaterally. No visible deformities. + slight POP R 2nd MPJ, no local warmth  POP R 5th met base   Radiographs: Taken and reviewed no interval change since  last visit. Assessment:   1. Freiberg's disease, right   2. Capsulitis of metatarsophalangeal (MTP) joint of right foot   3. Chronic foot pain, right   4. Enthesitis    Plan:  Patient was evaluated and treated and all questions answered.  Freiberg infarction -Asymptomatic  Enthesitis R 5th Met base -Discussed surgical intervention, patient not interested at this time. -Padding dispensed. -Defer injection today.  Plantar Fasciitis -PF brace dispensed.  Return in about 4 weeks (around 08/16/2018) for Capsulitis, Plantar fasciitis.

## 2018-07-24 ENCOUNTER — Other Ambulatory Visit: Payer: Self-pay | Admitting: Neurology

## 2018-08-22 ENCOUNTER — Other Ambulatory Visit: Payer: Self-pay

## 2018-08-22 ENCOUNTER — Ambulatory Visit: Payer: Medicare Other | Admitting: Podiatry

## 2018-08-22 ENCOUNTER — Encounter: Payer: Self-pay | Admitting: Podiatry

## 2018-08-22 VITALS — Temp 97.3°F | Resp 16

## 2018-08-22 DIAGNOSIS — M7751 Other enthesopathy of right foot: Secondary | ICD-10-CM | POA: Diagnosis not present

## 2018-08-22 DIAGNOSIS — M722 Plantar fascial fibromatosis: Secondary | ICD-10-CM | POA: Diagnosis not present

## 2018-08-22 DIAGNOSIS — M779 Enthesopathy, unspecified: Secondary | ICD-10-CM

## 2018-08-22 NOTE — Progress Notes (Signed)
Subjective:  Patient ID: Frances Robertson, female    DOB: Mar 16, 1941,  MRN: [id number]  Chief Complaint  Patient presents with  . Plantar Fasciitis    F/U Rt PF and campsulitis Pt. states," I can't tell much difference with the pain; 6/10 sharp intermittent pain." tx: fascial brace -pt states pain depends how she steps -pt denies N/V/F/Ch     78 y.o. female presents with the above complaint. Hx as above.   Review of Systems: Negative except as noted in the HPI. Denies N/V/F/Ch.  Past Medical History:  Diagnosis Date  . Anemia, unspecified   . Arthritis   . GERD (gastroesophageal reflux disease)    takes Omeprazole daily  . Headache(784.0)    takes Topamax daily and Atenolol daily;takes Tramadol daily as well  . Insomnia    takes Ambien nightly  . Joint pain   . Joint swelling   . Low back pain    herniated disc  . PAC (premature atrial contraction)   . Pneumonia hx of-as a child  . PONV (postoperative nausea and vomiting)   . Tinnitus   . Unspecified hypothyroidism    takes Synthroid daily    Current Outpatient Medications:  .  atenolol (TENORMIN) 50 MG tablet, Take 50 mg by mouth daily., Disp: , Rfl:  .  dexamethasone (DECADRON) 2 MG tablet, Take 3 tablets the first day, 2 the second and 1 the third day, Disp: 6 tablet, Rfl: 0 .  divalproex (DEPAKOTE ER) 500 MG 24 hr tablet, Take 1 tablet (500 mg total) by mouth at bedtime., Disp: 30 tablet, Rfl: 2 .  Erenumab-aooe (AIMOVIG) 140 MG/ML SOAJ, Inject 140 mg into the skin every 30 (thirty) days., Disp: 1 pen, Rfl: 3 .  esomeprazole (NEXIUM) 40 MG capsule, TAKE 1 CAPSULE BY MOUTH ONCE DAILY FOR 30 DAYS, Disp: , Rfl:  .  gabapentin (NEURONTIN) 300 MG capsule, TAKE 2 CAPSULES BY MOUTH THREE TIMES DAILY, Disp: 180 capsule, Rfl: 4 .  levothyroxine (SYNTHROID, LEVOTHROID) 100 MCG tablet, Take 100 mcg by mouth daily before breakfast., Disp: , Rfl:  .  meloxicam (MOBIC) 15 MG tablet, TAKE 1 TABLET BY MOUTH ONCE DAILY FOR 15 DAYS, Disp:  , Rfl:  .  meloxicam (MOBIC) 7.5 MG tablet, Take 1 tablet (7.5 mg total) by mouth daily., Disp: 30 tablet, Rfl: 0 .  nitrofurantoin, macrocrystal-monohydrate, (MACROBID) 100 MG capsule, TAKE 1 CAPSULE BY MOUTH TWICE DAILY FOR 7 DAYS, Disp: , Rfl:  .  omeprazole (PRILOSEC) 40 MG capsule, Take 40 mg by mouth daily., Disp: , Rfl:  .  topiramate (TOPAMAX) 50 MG tablet, TAKE 3 TABLETS BY MOUTH AT BEDTIME, Disp: 270 tablet, Rfl: 0 .  traMADol (ULTRAM) 50 MG tablet, TAKE 1 TABLET BY MOUTH EVERY 6 HOURS AS NEEDED MUST  LAST  60  DAYS, Disp: 180 tablet, Rfl: 0 .  zolpidem (AMBIEN) 10 MG tablet, Take 10 mg by mouth at bedtime. , Disp: , Rfl:   Social History   Tobacco Use  Smoking Status Never Smoker  Smokeless Tobacco Never Used    Allergies  Allergen Reactions  . Benzalkonium Chloride     unknown  . Hydrocodone-Acetaminophen     unknown  . Piroxicam     unknown  . Celandine [Chelidonium Majus] Nausea And Vomiting  . Celebrex [Celecoxib] Nausea And Vomiting  . Hydrocodone Other (See Comments)    sick  . Neosporin [Neomycin-Bacitracin Zn-Polymyx] Rash  . Other Rash and Other (See Comments)    [facility]  Patches  . Oxycodone Nausea And Vomiting   Objective:   Vitals:   08/22/18 1108  Resp: 16  Temp: (!) 97.3 F (36.3 C)   There is no height or weight on file to calculate BMI. Constitutional Well developed. Well nourished.  Vascular Dorsalis pedis pulses palpable bilaterally. Posterior tibial pulses palpable bilaterally. Capillary refill normal to all digits.  No cyanosis or clubbing noted. Pedal hair growth normal.  Neurologic Normal speech. Oriented to person, place, and time. Epicritic sensation to light touch grossly present bilaterally.  Dermatologic Nails well groomed and normal in appearance. No open wounds. Punctate keratosis 5th met base.  Orthopedic: Normal joint ROM without pain or crepitus bilaterally. No visible deformities. NO POP right heel or 2nd met POP R  5th met base   Radiographs: Taken and reviewed no interval change since last visit. Assessment:   1. Capsulitis of metatarsophalangeal (MTP) joint of right foot   2. Enthesitis   3. Plantar fasciitis    Plan:  Patient was evaluated and treated and all questions answered.  Freiberg infarction -Asymptomatic  Enthesitis R 5th Met base -Declined injetion and debridement of porokeratosis -F/u as needed should pain persist.  Plantar Fasciitis -Improving with brace. Continue brace.  No follow-ups on file.

## 2018-09-22 ENCOUNTER — Other Ambulatory Visit: Payer: Self-pay | Admitting: Neurology

## 2018-10-18 ENCOUNTER — Other Ambulatory Visit: Payer: Self-pay | Admitting: Neurology

## 2018-10-20 ENCOUNTER — Other Ambulatory Visit: Payer: Self-pay | Admitting: Neurology

## 2018-10-20 MED ORDER — TRAMADOL HCL 50 MG PO TABS: 50.0000 mg | ORAL_TABLET | Freq: Four times a day (QID) | ORAL | 0 refills | Status: DC | PRN

## 2018-10-20 NOTE — Progress Notes (Signed)
This patient apparently has been getting a 60-day prescription for Ultram every 30 days, I will reduce the amount on the prescription to 90 tablets which is a 30-day prescription, the next prescription will be due November 17, 2018.

## 2018-11-05 ENCOUNTER — Other Ambulatory Visit: Payer: Self-pay | Admitting: Neurology

## 2018-11-15 ENCOUNTER — Ambulatory Visit: Payer: Medicare Other | Admitting: Nurse Practitioner

## 2018-11-15 ENCOUNTER — Encounter: Payer: Self-pay | Admitting: Nurse Practitioner

## 2018-11-15 VITALS — BP 134/82 | HR 70 | Temp 98.3°F | Ht 62.0 in | Wt 143.5 lb

## 2018-11-15 DIAGNOSIS — R1012 Left upper quadrant pain: Secondary | ICD-10-CM

## 2018-11-15 NOTE — Progress Notes (Addendum)
ASSESSMENT / PLAN:   78 year old female here with a 8-month history of vague LUQ "soreness" with a bulge.  She has diastasis recti but says that is not the area where bulge occurs. I could not appreciate any abnormalities of her LUQ on exam.  -Her pain may be musculoskeletal but again the description is vague.  Absence of weight loss, nausea / vomiting are reassuring signs.  -She is on chronic PPI -Patient says she had a CTAP scan in January of this year in [place] to follow-up on a pelvic cyst.  While the study is several months old it would be worthwhile to see so will request.  -We discussed EGD.  Again her pain does not sound GI related but she is concerned and perhaps it is GI and she is just having difficulty describing it.  She is interested in pursuing EGD. The risks and benefits of EGD were discussed and the patient agrees to proceed.    ADDENDUM:11/23/18 Received CTAP w/ contrast from [facility] 05/02/18 No acute findings findings   HPI:    Chief Complaint:  Abdominal pain   Patient is a 78 year old female remotely known to Dr. Fuller Plan (2005).  She is here today for evaluation of LUQ discomfort and swelling.  Her symptoms started approximately 2 months ago.  Eating does not seem to make the symptoms any worse or any better.  Does notice the pain when standing but otherwise does not feel it is positional or related to movement such as bending, twisting, sneezing or coughing.  She describes the pain as just feeling 'sore". Her appetite is fine, no associated weight loss, nausea or vomiting.  Does complain of postprandial bloating sometimes. She describes back discomfort but then says that has been going on much longer and feels it is related to a mole being removed years ago.  Patient does not take NSAIDs.  Luxiq him listed on home med list in 2 different dosages but patient says she has never taken meloxicam/Mobic.  Bowel movements are normal, no rectal bleeding   Past Medical History:  Diagnosis Date  . Anemia, unspecified   . Arthritis   . GERD (gastroesophageal reflux disease)    takes Omeprazole daily  . Headache(784.0)    takes Topamax daily and Atenolol daily;takes Tramadol daily as well  . Insomnia    takes Ambien nightly  . Joint swelling   . Low back pain    herniated disc  . PAC (premature atrial contraction)   . Pneumonia hx of-as a child  . PONV (postoperative nausea and vomiting)   . Tinnitus   . Unspecified hypothyroidism    takes Synthroid daily     Past Surgical History:  Procedure Laterality Date  . CARPAL TUNNEL RELEASE Bilateral   . CATARACT EXTRACTION Bilateral   . CHOLECYSTECTOMY    . ESOPHAGOGASTRODUODENOSCOPY    . LUMBAR LAMINECTOMY/DECOMPRESSION MICRODISCECTOMY Right 07/23/2014   Procedure: Right Lumbar four-five microdiskectomy;  Surgeon: Newman Pies, MD;  Location: [facility];  Service: Neurosurgery;  Laterality: Right;  Right Lumbar four-five microdiskectomy  . TUBAL LIGATION Bilateral    Family History  Problem Relation Age of Onset  . Venous thrombosis Mother 65       vague history of this vs aneurysm somewhere  . Stroke Father 45       Sisters x 2 with breast cancer, brothers x 2 died with lung cancer  .  Cancer Other   . Cancer - Lung Brother   . Cancer - Lung Brother   . Sudden death Son 3   Social History   Tobacco Use  . Smoking status: Never Smoker  . Smokeless tobacco: Never Used  Substance Use Topics  . Alcohol use: No  . Drug use: No   Current Outpatient Medications  Medication Sig Dispense Refill  . atenolol (TENORMIN) 50 MG tablet Take 50 mg by mouth daily.    Eduard Roux (AIMOVIG) 140 MG/ML SOAJ Inject 140 mg into the skin every 30 (thirty) days. 1 pen 3  . esomeprazole (NEXIUM) 40 MG capsule TAKE 1 CAPSULE BY MOUTH ONCE DAILY FOR 30 DAYS    . gabapentin (NEURONTIN) 300 MG capsule TAKE 2 CAPSULES BY MOUTH THREE TIMES DAILY 180 capsule 0  . levothyroxine (SYNTHROID,  LEVOTHROID) 100 MCG tablet Take 100 mcg by mouth daily before breakfast.    . topiramate (TOPAMAX) 50 MG tablet TAKE 3 TABLETS BY MOUTH AT BEDTIME 270 tablet 0  . traMADol (ULTRAM) 50 MG tablet Take 1 tablet (50 mg total) by mouth every 6 (six) hours as needed (Must last 28 days). 90 tablet 0  . zolpidem (AMBIEN) 10 MG tablet Take 10 mg by mouth at bedtime.     Marland Kitchen dexamethasone (DECADRON) 2 MG tablet Take 3 tablets the first day, 2 the second and 1 the third day 6 tablet 0  . divalproex (DEPAKOTE ER) 500 MG 24 hr tablet Take 1 tablet (500 mg total) by mouth at bedtime. 30 tablet 2  . meloxicam (MOBIC) 15 MG tablet TAKE 1 TABLET BY MOUTH ONCE DAILY FOR 15 DAYS    . meloxicam (MOBIC) 7.5 MG tablet Take 1 tablet (7.5 mg total) by mouth daily. 30 tablet 0  . nitrofurantoin, macrocrystal-monohydrate, (MACROBID) 100 MG capsule TAKE 1 CAPSULE BY MOUTH TWICE DAILY FOR 7 DAYS    . omeprazole (PRILOSEC) 40 MG capsule Take 40 mg by mouth daily.     No current facility-administered medications for this visit.    Allergies  Allergen Reactions  . Benzalkonium Chloride     unknown  . Hydrocodone-Acetaminophen     unknown  . Piroxicam     unknown  . Celandine [Chelidonium Majus] Nausea And Vomiting  . Celebrex [Celecoxib] Nausea And Vomiting  . Hydrocodone Other (See Comments)    sick  . Neosporin [Neomycin-Bacitracin Zn-Polymyx] Rash  . Other Rash and Other (See Comments)    Icy Hot Patches  . Oxycodone Nausea And Vomiting     Review of Systems: All systems reviewed and negative except where noted in HPI.    Physical Exam:    Wt Readings from Last 3 Encounters:  11/15/18 143 lb 8 oz (65.1 kg)  02/28/18 147 lb (66.7 kg)  11/01/17 142 lb (64.4 kg)    BP 134/82 (BP Location: Right Arm, Patient Position: Sitting, Cuff Size: Normal)   Pulse 70   Temp 98.3 F (36.8 C) (Oral)   Ht 5\' 2"  (1.575 m)   Wt 143 lb 8 oz (65.1 kg)   SpO2 98%   BMI 26.25 kg/m  Constitutional:  Pleasant female in  no acute distress. Psychiatric: Normal mood and affect. Behavior is normal. EENT: Pupils normal.  Conjunctivae are normal. No scleral icterus. Neck supple.  Cardiovascular: Normal rate, regular rhythm. No edema Pulmonary/chest: Effort normal and breath sounds normal. No wheezing, rales or rhonchi. Abdominal: Soft, nondistended, nontender, + diastasis recti. Bowel sounds active throughout. There are no masses  palpable. No hepatomegaly. Neurological: Alert and oriented to person place and time. Skin: Skin is warm and dry. No rashes noted.  Tye Savoy, NP  11/15/2018, 11:22 AM  Cc: Imagene Riches, NP

## 2018-11-15 NOTE — Patient Instructions (Signed)
If you are age 79 or older, your body mass index should be between 23-30. Your Body mass index is 26.25 kg/m. If this is out of the aforementioned range listed, please consider follow up with your Primary Care Provider.  If you are age 49 or younger, your body mass index should be between 19-25. Your Body mass index is 26.25 kg/m. If this is out of the aformentioned range listed, please consider follow up with your Primary Care Provider.   You have been scheduled for an endoscopy. Please follow written instructions given to you at your visit today. If you use inhalers (even only as needed), please bring them with you   Thank you for choosing me and [facility].  Aline Brochure, NP

## 2018-11-16 NOTE — Progress Notes (Signed)
Reviewed and agree with management plan.  Eulalah Rupert T. Cable Fearn, MD FACG 

## 2018-11-21 ENCOUNTER — Telehealth: Payer: Self-pay | Admitting: Gastroenterology

## 2018-11-21 NOTE — Telephone Encounter (Signed)

## 2018-11-21 NOTE — Telephone Encounter (Signed)
Patient returned call and answered NO to all the Covid-19 screening questions °

## 2018-11-22 ENCOUNTER — Other Ambulatory Visit: Payer: Self-pay

## 2018-11-22 ENCOUNTER — Encounter: Payer: Self-pay | Admitting: Gastroenterology

## 2018-11-22 ENCOUNTER — Ambulatory Visit: Payer: Medicare Other | Admitting: Gastroenterology

## 2018-11-22 VITALS — BP 149/82 | HR 70 | Temp 98.7°F | Ht 62.0 in | Wt 143.0 lb

## 2018-11-22 MED ORDER — SODIUM CHLORIDE 0.9 % IV SOLN: 500.0000 mL | Freq: Once | INTRAVENOUS | Status: DC

## 2018-11-22 NOTE — Progress Notes (Signed)
Pt states that she ate oatmeal at 0800 and had a cheese sandwich at 1000.  Spoke with Dr. Fuller Plan.  He states she needs to be cancelled today and re-scheduled for another date.  Pt. Agrees

## 2018-11-29 ENCOUNTER — Encounter: Payer: Medicare Other | Admitting: Gastroenterology

## 2018-12-11 ENCOUNTER — Other Ambulatory Visit: Payer: Self-pay | Admitting: Neurology

## 2018-12-12 ENCOUNTER — Other Ambulatory Visit: Payer: Self-pay | Admitting: Neurology

## 2018-12-16 ENCOUNTER — Other Ambulatory Visit: Payer: Self-pay

## 2018-12-16 ENCOUNTER — Ambulatory Visit (AMBULATORY_SURGERY_CENTER): Payer: Self-pay

## 2018-12-16 VITALS — Ht 62.0 in | Wt 143.0 lb

## 2018-12-16 DIAGNOSIS — R1012 Left upper quadrant pain: Secondary | ICD-10-CM

## 2018-12-16 NOTE — Progress Notes (Signed)
Denies allergies to eggs or soy products. Denies complication of anesthesia or sedation. Denies use of weight loss medication. Denies use of O2.   Patient declined Emmi instructions.

## 2018-12-20 ENCOUNTER — Encounter: Payer: Self-pay | Admitting: Gastroenterology

## 2018-12-30 ENCOUNTER — Telehealth: Payer: Self-pay | Admitting: Gastroenterology

## 2018-12-30 NOTE — Telephone Encounter (Signed)

## 2019-01-02 ENCOUNTER — Ambulatory Visit (AMBULATORY_SURGERY_CENTER): Payer: Medicare Other | Admitting: Gastroenterology

## 2019-01-02 ENCOUNTER — Other Ambulatory Visit: Payer: Self-pay

## 2019-01-02 ENCOUNTER — Other Ambulatory Visit: Payer: Self-pay | Admitting: Gastroenterology

## 2019-01-02 ENCOUNTER — Encounter: Payer: Self-pay | Admitting: Gastroenterology

## 2019-01-02 VITALS — BP 144/70 | HR 71 | Temp 97.5°F | Resp 15 | Ht 62.0 in | Wt 143.0 lb

## 2019-01-02 DIAGNOSIS — K449 Diaphragmatic hernia without obstruction or gangrene: Secondary | ICD-10-CM

## 2019-01-02 DIAGNOSIS — R1310 Dysphagia, unspecified: Secondary | ICD-10-CM

## 2019-01-02 DIAGNOSIS — K219 Gastro-esophageal reflux disease without esophagitis: Secondary | ICD-10-CM | POA: Diagnosis not present

## 2019-01-02 DIAGNOSIS — K2970 Gastritis, unspecified, without bleeding: Secondary | ICD-10-CM

## 2019-01-02 DIAGNOSIS — K319 Disease of stomach and duodenum, unspecified: Secondary | ICD-10-CM

## 2019-01-02 DIAGNOSIS — R1012 Left upper quadrant pain: Secondary | ICD-10-CM

## 2019-01-02 MED ORDER — DICYCLOMINE HCL 10 MG PO CAPS: 10.0000 mg | ORAL_CAPSULE | Freq: Three times a day (TID) | ORAL | 4 refills | Status: DC

## 2019-01-02 MED ORDER — SODIUM CHLORIDE 0.9 % IV SOLN: 500.0000 mL | Freq: Once | INTRAVENOUS | Status: DC

## 2019-01-02 NOTE — Patient Instructions (Signed)
Please see handouts given to you on Polyps and Hiatal Hernia. Please follow the Post Dilation Diet handout. You have a new rx that can be picked up at your pharmacy.       YOU HAD AN ENDOSCOPIC PROCEDURE TODAY AT [facility]:   Refer to the procedure report that was given to you for any specific questions about what was found during the examination.  If the procedure report does not answer your questions, please call your gastroenterologist to clarify.  If you requested that your care partner not be given the details of your procedure findings, then the procedure report has been included in a sealed envelope for you to review at your convenience later.  YOU SHOULD EXPECT: Some feelings of bloating in the abdomen. Passage of more gas than usual.  Walking can help get rid of the air that was put into your GI tract during the procedure and reduce the bloating. If you had a lower endoscopy (such as a colonoscopy or flexible sigmoidoscopy) you may notice spotting of blood in your stool or on the toilet paper. If you underwent a bowel prep for your procedure, you may not have a normal bowel movement for a few days.  Please Note:  You might notice some irritation and congestion in your nose or some drainage.  This is from the oxygen used during your procedure.  There is no need for concern and it should clear up in a day or so.  SYMPTOMS TO REPORT IMMEDIATELY:   Following lower endoscopy (colonoscopy or flexible sigmoidoscopy):  Excessive amounts of blood in the stool  Significant tenderness or worsening of abdominal pains  Swelling of the abdomen that is new, acute  Fever of 100F or higher   Following upper endoscopy (EGD)  Vomiting of blood or coffee ground material  New chest pain or pain under the shoulder blades  Painful or persistently difficult swallowing  New shortness of breath  Fever of 100F or higher  Black, tarry-looking stools  For urgent or emergent issues, a  gastroenterologist can be reached at any hour by calling [phone].   DIET:  Clear liquids for 2 hours then soft diet for the rest of the day starting at 1:00. Tomorrow you may proceed to your regular diet.  Drink plenty of fluids but you should avoid alcoholic beverages for 24 hours.  ACTIVITY:  You should plan to take it easy for the rest of today and you should NOT DRIVE or use heavy machinery until tomorrow (because of the sedation medicines used during the test).    FOLLOW UP: Our staff will call the number listed on your records 48-72 hours following your procedure to check on you and address any questions or concerns that you may have regarding the information given to you following your procedure. If we do not reach you, we will leave a message.  We will attempt to reach you two times.  During this call, we will ask if you have developed any symptoms of COVID 19. If you develop any symptoms (ie: fever, flu-like symptoms, shortness of breath, cough etc.) before then, please call [phone].  If you test positive for Covid 19 in the 2 weeks post procedure, please call and report this information to [place].    If any biopsies were taken you will be contacted by phone or by letter within the next 1-3 weeks.  Please call us at [phone] if you have not heard about the biopsies in  3 weeks.    SIGNATURES/CONFIDENTIALITY: You and/or your care partner have signed paperwork which will be entered into your electronic medical record.  These signatures attest to the fact that that the information above on your After Visit Summary has been reviewed and is understood.  Full responsibility of the confidentiality of this discharge information lies with you and/or your care-partner.

## 2019-01-02 NOTE — Progress Notes (Signed)
PT taken to PACU. Monitors in place. VSS. Report given to RN. 

## 2019-01-02 NOTE — Progress Notes (Signed)
Called to room to assist during endoscopic procedure.  Patient ID and intended procedure confirmed with present staff. Received instructions for my participation in the procedure from the performing physician.  

## 2019-01-02 NOTE — Op Note (Signed)
[facility] Patient Name: Frances Robertson Procedure Date: 01/02/2019 10:32 AM MRN: [id number] Endoscopist: Ladene Artist , MD Age: 78 Referring MD:  Date of Birth: 09/28/1940 Gender: Female Account #: [id number] Procedure:                Upper GI endoscopy Indications:              Abdominal pain in the left upper quadrant,                            Dysphagia, Gastroesophageal reflux disease Medicines:                Monitored Anesthesia Care Procedure:                Pre-Anesthesia Assessment:                           - Prior to the procedure, a History and Physical                            was performed, and patient medications and                            allergies were reviewed. The patient's tolerance of                            previous anesthesia was also reviewed. The risks                            and benefits of the procedure and the sedation                            options and risks were discussed with the patient.                            All questions were answered, and informed consent                            was obtained. Prior Anticoagulants: The patient has                            taken no previous anticoagulant or antiplatelet                            agents. ASA Grade Assessment: II - A patient with                            mild systemic disease. After reviewing the risks                            and benefits, the patient was deemed in                            satisfactory condition to undergo the procedure.  After obtaining informed consent, the endoscope was                            passed under direct vision. Throughout the                            procedure, the patient's blood pressure, pulse, and                            oxygen saturations were monitored continuously. The                            Endoscope was introduced through the mouth, and                            advanced to the  second part of duodenum. The upper                            GI endoscopy was accomplished without difficulty.                            The patient tolerated the procedure well. Scope In: Scope Out: Findings:                 No endoscopic abnormality was evident in the                            esophagus to explain the patient's complaint of                            dysphagia. It was decided, however, to proceed with                            dilation of the entire esophagus. A guidewire was                            placed and the scope was withdrawn. Dilation was                            performed with a Savary dilator with no resistance                            at 16 mm.                           Patchy mildly erythematous mucosa without bleeding                            was found in the gastric body and in the gastric                            antrum. Biopsies were taken with a cold forceps for  histology.                           A small hiatal hernia was present.                           The exam of the stomach was otherwise normal.                           The duodenal bulb and second portion of the                            duodenum were normal. Complications:            No immediate complications. Estimated Blood Loss:     Estimated blood loss was minimal. Impression:               - No endoscopic esophageal abnormality to explain                            patient's dysphagia. Esophagus dilated.                           - Erythematous mucosa in the gastric body and                            antrum. Biopsied.                           - Small hiatal hernia.                           - Normal duodenal bulb and second portion of the                            duodenum. Recommendation:           - Patient has a contact number available for                            emergencies. The signs and symptoms of potential                             delayed complications were discussed with the                            patient. Return to normal activities tomorrow.                            Written discharge instructions were provided to the                            patient.                           - Clear liquid diet for 2 hours, then advance as  tolerated to soft diet today.                           - Resume prior diet tomorrow.                           - Follow antireflux measures long term.                           - Continue present medications.                           - Await pathology results.                           - Bentyl (dicyclomine) 10 mg PO QID 30 min AC and                            HS, 1 year of refills. Ladene Artist, MD 01/02/2019 10:59:30 AM This report has been signed electronically.

## 2019-01-02 NOTE — Progress Notes (Signed)
Pt's states no medical or surgical changes since previsit or office visit.  Temp KA VS CW 

## 2019-01-03 ENCOUNTER — Telehealth: Payer: Self-pay

## 2019-01-03 NOTE — Telephone Encounter (Signed)
Scott from [facility] called with the Approval for the urgent P.A. request but when he was told to hold so that the RN can be reached he hung up. Please advise.

## 2019-01-03 NOTE — Telephone Encounter (Signed)
Received fax from [facility] stating [place] PA has been approved 01/03/19-01/03/2020.

## 2019-01-03 NOTE — Telephone Encounter (Signed)
I called the patient.  The patient went to the pharmacy to pick up her prescription what she thought was from her gastroenterologist, but she actually picked up the [place].  She claims that the Aimovig is not needed now, her headaches are better, I told her to save the medication and she could utilize this if her headache got worse in the future.  She claims that she cannot give the shot to herself, if need be we can give it to her through the office.

## 2019-01-03 NOTE — Telephone Encounter (Signed)
PA for Aimovig has been submitted via cover my meds to [place] .  (Key: [id number])  Your information has been submitted to [place]. [employer] [place] will review the request and notify you of the determination decision directly, typically within 3 business days of your submission and once all necessary information is received.  You will also receive your request decision electronically. To check for an update later, open the request again from your dashboard.  If [employer] [place] has not responded within the specified timeframe or if you have any questions about your PA submission, contac

## 2019-01-03 NOTE — Telephone Encounter (Signed)
Patient called and stated that she cannot afford $83 a month for her Aimovig even with her insurance. She stated that she currently has a hernia. She would like to know what else can be done. Please advise.

## 2019-01-03 NOTE — Telephone Encounter (Signed)
With PA approval pt has an estimated responsibility of 83$ per month with aimovig and cannot afford. Would like to know if other alternatives could be recommended?

## 2019-01-04 ENCOUNTER — Telehealth: Payer: Self-pay

## 2019-01-04 NOTE — Telephone Encounter (Signed)
  Follow up Call-  Call back number 01/02/2019 11/22/2018  Post procedure Call Back phone  # [phone]  Permission to leave phone message Yes Yes  Some recent data might be hidden     Patient questions:  Do you have a fever, pain , or abdominal swelling? No. Pain Score  0 *  Have you tolerated food without any problems? Yes.    Have you been able to return to your normal activities? Yes.    Do you have any questions about your discharge instructions: Diet   No. Medications  No. Follow up visit  No.  Do you have questions or concerns about your Care? No.  Actions: * If pain score is 4 or above: No action needed, pain <4. 1. Have you developed a fever since your procedure? no  2.   Have you had an respiratory symptoms (SOB or cough) since your procedure? no  3.   Have you tested positive for COVID 19 since your procedure no  4.   Have you had any family members/close contacts diagnosed with the COVID 19 since your procedure?  no   If yes to any of these questions please route to Joylene John, RN and Alphonsa Gin, Therapist, sports.

## 2019-01-04 NOTE — Telephone Encounter (Signed)
  Follow up Call-  Call back number 01/02/2019 11/22/2018  Post procedure Call Back phone  # [phone]  Permission to leave phone message Yes Yes  Some recent data might be hidden     Left message

## 2019-01-05 ENCOUNTER — Encounter: Payer: Self-pay | Admitting: Gastroenterology

## 2019-01-06 ENCOUNTER — Telehealth: Payer: Self-pay | Admitting: Gastroenterology

## 2019-01-06 NOTE — Telephone Encounter (Signed)
The pt is calling with a question regarding dicyclomine and bloating.  She says she has continued bloating while taking dicyclomine and nexium.  She says this is a long term problem.  She will try gas x with meals and will add pepcid at bedtime.  She will call back if things do not improve. Dr Delma Officer

## 2019-01-13 ENCOUNTER — Other Ambulatory Visit: Payer: Self-pay | Admitting: Neurology

## 2019-01-16 ENCOUNTER — Telehealth: Payer: Self-pay | Admitting: Gastroenterology

## 2019-01-16 NOTE — Telephone Encounter (Signed)
Pt aware.

## 2019-01-16 NOTE — Telephone Encounter (Signed)
IBgard 1-2 po tid 

## 2019-01-16 NOTE — Telephone Encounter (Signed)
Pt states the dicyclomine caused her to have blurry vision and she could not see at night. Requesting a different medication be sent in for her. Please advise.

## 2019-02-16 ENCOUNTER — Other Ambulatory Visit: Payer: Self-pay | Admitting: Neurology

## 2019-02-21 ENCOUNTER — Telehealth: Payer: Self-pay | Admitting: Neurology

## 2019-02-21 NOTE — Telephone Encounter (Signed)
LVM for pt to call back to r/s 11/23 appt. Per Tonita Cong RN pt needs to be move to NP's schedule

## 2019-02-23 ENCOUNTER — Other Ambulatory Visit: Payer: Self-pay | Admitting: Neurology

## 2019-02-27 ENCOUNTER — Telehealth: Payer: Self-pay | Admitting: Nurse Practitioner

## 2019-02-27 MED ORDER — GABAPENTIN 300 MG PO CAPS: 600.0000 mg | ORAL_CAPSULE | Freq: Three times a day (TID) | ORAL | 1 refills | Status: DC

## 2019-02-27 NOTE — Addendum Note (Signed)
Addended by: Verlin Grills T on: 02/27/2019 10:57 AM   Modules accepted: Orders

## 2019-02-27 NOTE — Telephone Encounter (Signed)
Pt has called for a refill on her gabapentin (NEURONTIN) 300 MG capsule   [id number]

## 2019-02-27 NOTE — Telephone Encounter (Signed)
Rx has been refilled pending her appt with SS, NP.

## 2019-03-06 ENCOUNTER — Ambulatory Visit: Payer: Medicare Other | Admitting: Neurology

## 2019-03-12 NOTE — Progress Notes (Deleted)
PATIENT: Frances Robertson DOB: 1941/02/04  REASON FOR VISIT: follow up HISTORY FROM: patient  HISTORY OF PRESENT ILLNESS: Today 03/12/19  Frances Robertson is a 78 year old female with history of chronic headaches.  She is taking Topamax, gabapentin, and Ultram acutely.  She works full-time at a nursing home.  She stopped Depakote earlier this year due to hair loss.  She was prescribed Aimovig, but did not start the medication because it was too expensive and she says her headaches improved.  HISTORY  11/18/2019CM Frances Robertson, 78 year old female returns for follow-up with history of headaches which are chronic.  She is currently on Topamax 150 mg at bedtime, Depakote 500 mg at bedtime and gabapentin 302 capsules 3 times daily.  She takes Ultram acutely.  She has been placed on Aimovig however her PA has just been approved and she has not received the medication yet.  She does not wish to increase any of her other medications that she is currently on for headache at this time.  She is hoping that the [place] is going to be very beneficial.  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  She sleeps well at night.  She returns for reevaluation  REVIEW OF SYSTEMS: Out of a complete 14 system review of symptoms, the patient complains only of the following symptoms, and all other reviewed systems are negative.  ALLERGIES: Allergies  Allergen Reactions  . Benzalkonium Chloride     unknown  . Hydrocodone-Acetaminophen     unknown  . Piroxicam     unknown  . Celandine [Chelidonium Majus] Nausea And Vomiting  . Celebrex [Celecoxib] Nausea And Vomiting  . Hydrocodone Other (See Comments)    sick  . Neosporin [Neomycin-Bacitracin Zn-Polymyx] Rash  . Other Rash and Other (See Comments)    Icy Hot Patches  . Oxycodone Nausea And Vomiting    HOME MEDICATIONS: Outpatient Medications Prior to Visit  Medication Sig Dispense Refill  . atenolol (TENORMIN) 50 MG tablet Take 50 mg by mouth daily.     Marland Kitchen dicyclomine (BENTYL) 10 MG capsule Take 1 capsule (10 mg total) by mouth 4 (four) times daily -  before meals and at bedtime. 30 min AC and HS 90 capsule 4  . divalproex (DEPAKOTE ER) 500 MG 24 hr tablet Take 1 tablet (500 mg total) by mouth at bedtime. (Patient not taking: Reported on 12/16/2018) 30 tablet 2  . esomeprazole (NEXIUM) 40 MG capsule TAKE 1 CAPSULE BY MOUTH ONCE DAILY FOR 30 DAYS    . gabapentin (NEURONTIN) 300 MG capsule Take 2 capsules (600 mg total) by mouth 3 (three) times daily. 180 capsule 1  . levothyroxine (SYNTHROID, LEVOTHROID) 100 MCG tablet Take 100 mcg by mouth daily before breakfast.    . topiramate (TOPAMAX) 50 MG tablet TAKE 3 TABLETS BY MOUTH AT BEDTIME 270 tablet 0  . traMADol (ULTRAM) 50 MG tablet TAKE 1 TABLET BY MOUTH EVERY 6 HOURS AS NEEDED MUST  LAST  60  DAYS 180 tablet 0  . zolpidem (AMBIEN) 10 MG tablet Take 10 mg by mouth at bedtime.      No facility-administered medications prior to visit.     PAST MEDICAL HISTORY: Past Medical History:  Diagnosis Date  . Allergy   . Anemia, unspecified   . Arthritis   . Cataract   . GERD (gastroesophageal reflux disease)    takes Omeprazole daily  . Headache(784.0)    takes Topamax daily and Atenolol daily;takes Tramadol daily as well  .  Insomnia    takes Ambien nightly  . Joint pain   . Joint swelling   . Low back pain    herniated disc  . PAC (premature atrial contraction)   . Pneumonia hx of-as a child  . PONV (postoperative nausea and vomiting)   . Tinnitus   . Unspecified hypothyroidism    takes Synthroid daily    PAST SURGICAL HISTORY: Past Surgical History:  Procedure Laterality Date  . CARPAL TUNNEL RELEASE Bilateral   . CATARACT EXTRACTION Bilateral   . CHOLECYSTECTOMY    . ESOPHAGOGASTRODUODENOSCOPY    . LUMBAR LAMINECTOMY/DECOMPRESSION MICRODISCECTOMY Right 07/23/2014   Procedure: Right Lumbar four-five microdiskectomy;  Surgeon: Newman Pies, MD;  Location: [facility];  Service:  Neurosurgery;  Laterality: Right;  Right Lumbar four-five microdiskectomy  . TUBAL LIGATION Bilateral     FAMILY HISTORY: Family History  Problem Relation Age of Onset  . Venous thrombosis Mother 49       vague history of this vs aneurysm somewhere  . Stroke Father 65       Sisters x 2 with breast cancer, brothers x 2 died with lung cancer  . Cancer Other   . Cancer - Lung Brother   . Cancer - Lung Brother   . Sudden death Son 104  . Colon cancer Neg Hx   . Esophageal cancer Neg Hx   . Stomach cancer Neg Hx   . Rectal cancer Neg Hx     SOCIAL HISTORY: Social History   Socioeconomic History  . Marital status: Widowed    Spouse name: Not on file  . Number of children: 2  . Years of education: 9  . Highest education level: Not on file  Occupational History  . Occupation: Nurse, children's    Employer: [employer].    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    Social Needs  . Financial resource strain: Not on file  . Food insecurity    Worry: Not on file    Inability: Not on file  . Transportation needs    Medical: Not on file    Non-medical: Not on file  Tobacco Use  . Smoking status: Never Smoker  . Smokeless tobacco: Never Used  Substance and Sexual Activity  . Alcohol use: No  . Drug use: No  . Sexual activity: Not on file  Lifestyle  . Physical activity    Days per week: Not on file    Minutes per session: Not on file  . Stress: Not on file  Relationships  . Social Herbalist on phone: Not on file    Gets together: Not on file    Attends religious service: Not on file    Active member of club or organization: Not on file    Attends meetings of clubs or organizations: Not on file    Relationship status: Not on file  . Intimate partner violence    Fear of current or ex partner: Not on file    Emotionally abused: Not on file    Physically abused: Not on file    Forced sexual activity: Not on file  Other Topics Concern  . Not  on file  Social History Narrative   Lives alone.    Patient is right handed.   Patient does not drink caffeine.      PHYSICAL EXAM  There were no vitals filed for this visit. There is no height or weight on file to calculate  BMI.  Generalized: Well developed, in no acute distress   Neurological examination  Mentation: Alert oriented to time, place, history taking. Follows all commands speech and language fluent Cranial nerve II-XII: Pupils were equal round reactive to light. Extraocular movements were full, visual field were full on confrontational test. Facial sensation and strength were normal. Uvula tongue midline. Head turning and shoulder shrug  were normal and symmetric. Motor: The motor testing reveals 5 over 5 strength of all 4 extremities. Good symmetric motor tone is noted throughout.  Sensory: Sensory testing is intact to soft touch on all 4 extremities. No evidence of extinction is noted.  Coordination: Cerebellar testing reveals good finger-nose-finger and heel-to-shin bilaterally.  Gait and station: Gait is normal. Tandem gait is normal. Romberg is negative. No drift is seen.  Reflexes: Deep tendon reflexes are symmetric and normal bilaterally.   DIAGNOSTIC DATA (LABS, IMAGING, TESTING) - I reviewed patient records, labs, notes, testing and imaging myself where available.  Lab Results  Component Value Date   WBC 4.8 07/12/2014   HGB 11.0 (L) 07/12/2014   HCT 34.4 (L) 07/12/2014   MCV 88.7 07/12/2014   PLT 168 07/12/2014      Component Value Date/Time   NA 140 07/12/2014 1126   K 4.5 07/12/2014 1126   CL 107 07/12/2014 1126   CO2 25 07/12/2014 1126   GLUCOSE 105 (H) 07/12/2014 1126   BUN 16 07/12/2014 1126   CREATININE 0.70 07/12/2014 1126   CALCIUM 9.5 07/12/2014 1126   GFRNONAA 83 (L) 07/12/2014 1126   GFRAA >90 07/12/2014 1126   No results found for: CHOL, HDL, LDLCALC, LDLDIRECT, TRIG, CHOLHDL No results found for: HGBA1C No results found for:  VITAMINB12 No results found for: TSH    ASSESSMENT AND PLAN 78 y.o. year old female  has a past medical history of Allergy, Anemia, unspecified, Arthritis, Cataract, GERD (gastroesophageal reflux disease), Headache(784.0), Insomnia, Joint pain, Joint swelling, Low back pain, PAC (premature atrial contraction), Pneumonia (hx of-as a child), PONV (postoperative nausea and vomiting), Tinnitus, and Unspecified hypothyroidism. here with ***   I spent 15 minutes with the patient. 50% of this time was spent   Butler Denmark, [place], DNP 03/12/2019, 8:21 PM [facility] [address] [place], [place] [address] [phone]

## 2019-03-13 ENCOUNTER — Ambulatory Visit: Payer: Medicare Other | Admitting: Neurology

## 2019-03-27 ENCOUNTER — Telehealth: Payer: Self-pay | Admitting: Neurology

## 2019-03-27 NOTE — Telephone Encounter (Signed)
Patient with COVID and prolonged symptoms ever sin e infection. Lives in a nursing home , was infected by OT (?).  She has been very nauseated and has severe headaches. I asked her to take an extra Neurontin pill just to help her sleep. That was Sunday 03-26-2019.

## 2019-03-27 NOTE — Telephone Encounter (Signed)
I contacted the pt and advised we could proceed with scheduling telephone visit with SS, NP. Pt scheduled for 03/29/2019 at 1045 am.    Pt understands that although there may be some limitations with this type of visit, we will take all precautions to reduce any security or privacy concerns.  Pt understands that this will be treated like an in office visit and we will file with pt's insurance, and there may be a patient responsible charge related to this service.

## 2019-03-27 NOTE — Telephone Encounter (Signed)
Pt called in and stated she was diagnosed with COVID and has been COVID free since Thanksgiving but has been having a bad migraine and wants to know if something can be sent in for her to take to help the migraine go away.

## 2019-03-27 NOTE — Telephone Encounter (Signed)
I contacted the pt. She reports migraines have been out of control and wanted to see what medications could be recommended? I advise the pt the visit we had with her was 02/28/2018 and we would need to see her before making further recommendations. Pt states she is too week to drive to our office and she does not have the capability to do a virtual visit.  I advised I would ask SS, NP if she would be agreeable to completing a virtual visit.

## 2019-03-29 ENCOUNTER — Encounter: Payer: Self-pay | Admitting: Neurology

## 2019-03-29 ENCOUNTER — Ambulatory Visit (INDEPENDENT_AMBULATORY_CARE_PROVIDER_SITE_OTHER): Payer: Medicare Other | Admitting: Neurology

## 2019-03-29 ENCOUNTER — Other Ambulatory Visit: Payer: Self-pay

## 2019-03-29 DIAGNOSIS — G43019 Migraine without aura, intractable, without status migrainosus: Secondary | ICD-10-CM

## 2019-03-29 MED ORDER — DEXAMETHASONE 2 MG PO TABS: ORAL_TABLET | ORAL | 0 refills | Status: DC

## 2019-03-29 NOTE — Progress Notes (Signed)
I have read the note, and I agree with the clinical assessment and plan.  Yatziry Deakins K Justeen Hehr   

## 2019-03-29 NOTE — Progress Notes (Signed)
Virtual Visit via Telephone Note  I connected with Frances Robertson on 03/29/19 at 10:45 AM EST by telephone and verified that I am speaking with the correct person using two identifiers.  Location: Patient: At her home  Provider: In the office    I discussed the limitations, risks, security and privacy concerns of performing an evaluation and management service by telephone and the availability of in person appointments. I also discussed with the patient that there may be a patient responsible charge related to this service. The patient expressed understanding and agreed to proceed.   History of Present Illness: 03/29/2019 SS: Frances Robertson is a 78 year old female with history of headaches which are chronic.  She has a longstanding history of headache.  She was last seen in this office in November 2019, at that time she was taking Topamax, Depakote, and gabapentin, along with tramadol for acute headache.  She indicates her headaches have been doing well, meaning she continues to have them, but they have been mild to moderate.  She came down with Covid at the end of November.  She reports that she recovered from Covid, for the last 5 days has had a constant migraine headache.  She indicates this is her typical migraine headache.  She says the pain is located at her left temple, which is always the site of her headaches.  She denies photophobia or phonophobia, she endorses some mild nausea.  She has been taking tramadol, with some benefit, but would not completely relieve the headache.  She has recently been started on Lexapro for increased anxiety.  She denies any falls or changes to her gait or balance.  She denies any new numbness or weakness to her arms or legs.  She denies fever.  She drives a car. She has been out of work since her Covid diagnosis. She says she has fully recovered from Covid.  She works full-time at a nursing home.  She indicates she is no longer taking Depakote.  She presents today  for evaluation via telephone visit.   HISTORY OF PRESENT ILLNESS:UPDATE 11/18/2019CM Frances Robertson, 78 year old female returns for follow-up with history of headaches which are chronic.  She is currently on Topamax 150 mg at bedtime, Depakote 500 mg at bedtime and gabapentin 302 capsules 3 times daily.  She takes Ultram acutely.  She has been placed on Aimovig however her PA has just been approved and she has not received the medication yet.  She does not wish to increase any of her other medications that she is currently on for headache at this time.  She is hoping that the [place] is going to be very beneficial.  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  She sleeps well at night.  She returns for reevaluation   Observations/Objective: Via telephone visit, is alert and oriented, answers questions appropriately, speech is clear and concise, good historian  Assessment and Plan: 1.  Chronic migraine headache, diagnosed with Covid end of November, now fully recovered   She has a lifelong history of chronic headache.  She indicates her headaches have been doing fairly well, but has had a 5 days constant headache.  The headache is located at her left temple, which is chronically her pain location, and she describes it as her typical migraine. She denies any concerning emergent neurological symptoms. I reviewed the chart, she had a similar prolonged headache in mid 2019, requiring treatment with a 6-day prednisone course, but this was not beneficial, followed  by a 3-day Decadron course that abated the headache.  I will reorder the 3-day 2 mg Decadron taper, she will increase gabapentin taking 600 mg up to 4 times a day.  She will remain on Topamax 150 mg at bedtime.  She has been prescribed Aimovig, but says she cannot give herself a shot, and that the medication was too expensive.  Since last seen in November 2019, she has apparently stopped taking Depakote. We may resume the Depakote if needed. She has  been started on Lexapro fairly recently, could consider this could have triggered her headache. She remains on Tramadol as needed.  She will follow-up in 3 months or sooner if needed.  Follow Up Instructions: 3 months 07/03/2019 8:45 am   I discussed the assessment and treatment plan with the patient. The patient was provided an opportunity to ask questions and all were answered. The patient agreed with the plan and demonstrated an understanding of the instructions.   The patient was advised to call back or seek an in-person evaluation if the symptoms worsen or if the condition fails to improve as anticipated.  I provided 20 minutes of non-face-to-face time during this encounter.  Evangeline Dakin, DNP  [facility] [address] [place], [place] [address] [phone]

## 2019-03-31 ENCOUNTER — Other Ambulatory Visit: Payer: Self-pay | Admitting: Neurology

## 2019-03-31 NOTE — Telephone Encounter (Signed)
1) Medication: dexamethasone (DECADRON) 2 MG tablet   2) Pharmacy of Choice: [id number] - [place], [place] - [address]  [address], [place] [address]

## 2019-04-03 MED ORDER — DEXAMETHASONE 2 MG PO TABS: ORAL_TABLET | ORAL | 0 refills | Status: DC

## 2019-04-03 NOTE — Telephone Encounter (Signed)
I spoke to pt and relayed that another decadron taper is ok, but if no help then will need to adjust her preventative meds.  She verbalized understanding.

## 2019-04-03 NOTE — Telephone Encounter (Signed)
I called pt and she is level 7 today.  She states that the headache on L side is better but still there.  She is taking the gabapentin 600mg  po QID, topamax 150mg  po qhs. She feels like another round of steroids may knock it out.  Please advise.  (she is eating well, more active, insomnia issue).  All related to se of steroids.

## 2019-04-03 NOTE — Telephone Encounter (Signed)
I will send in Decadron 2 mg, 3-day taper for repeat.  I will not continue to order, if her headaches continue, we will need to adjust her preventative medications.

## 2019-04-03 NOTE — Telephone Encounter (Signed)
I placed the order.

## 2019-04-03 NOTE — Addendum Note (Signed)
Addended by: Brandon Melnick on: 04/03/2019 04:45 PM   Modules accepted: Orders

## 2019-04-10 NOTE — Telephone Encounter (Signed)
I called pt.  She stated the decadron (2nd round) did not help with her L temple headache. She stated that she is taking 3 caps QID (I relayed when we last spoke she was taking 2 caps QID).  I relayed that miscommunication (she should be on TID to QID from when she was in the office for appt.  She is alto taking tramadol every 6 hours, (I said this is prn, as needed) she said she is taking every 6 hours.  Has 180 tabs from 02-20-19.  I will ask what SS/NP would like to do relating to her headache.

## 2019-04-10 NOTE — Telephone Encounter (Signed)
Patient advised that the dexamethasone (DECADRON) 2 MG tablet  did not help her headache. And the gabapentin is too strong for her ( 3 four times a day) patient would like to know what would be the next alternative. Please follow up.

## 2019-04-10 NOTE — Telephone Encounter (Signed)
Review of notes, show Aimovig has been approved through September 2021 (Megan B note 01/03/2019), she can get started on this or I can restart Depakote. She can continue gabapentin 600 mg up to 4 times a day. Continue Topamax. Also, she said she was recently started on Lexapro, Micromedex lists headache as side effect in 24%, may need to stop and switch to something else from PCP if headaches worsened around the same time. Aimovig or Depakote? I do not wish to do another round of Decadron. She gets tramadol from this office.

## 2019-04-11 NOTE — Telephone Encounter (Signed)
I called pt and relayed that pt per SS/NP she can take aimovig, or depakote as other options.  She feels that the lexapro was started about 2 wks ago, and the headache was 3 wks ago, so does not feel that lexapro the problem.  I relayed no more decadron.  She is to keep on tramadol.  The gabapentin 600mg  po qid, but she is going to go back down to TID dosing of 600mg  as the QID is too much, in that she needs to drive.  I relayed that she can go back to TID, and back at her baseline she will let us know what she wants to do (relating to depakote).  aimovig is not something she wants to give herself.

## 2019-04-20 ENCOUNTER — Telehealth: Payer: Self-pay | Admitting: Neurology

## 2019-04-20 NOTE — Telephone Encounter (Signed)
Patient called in and stated she went back to work on yesterday and she is feeling fine and doing ok.

## 2019-04-21 ENCOUNTER — Other Ambulatory Visit: Payer: Self-pay

## 2019-04-21 NOTE — Telephone Encounter (Signed)
1) Medication(s) Requested (by name): GABAPENTIN TRAMADOL TOPAMAX 2) Pharmacy of Choice:  [facility] in [place] '

## 2019-04-24 MED ORDER — TRAMADOL HCL 50 MG PO TABS: ORAL_TABLET | ORAL | 0 refills | Status: DC

## 2019-04-24 MED ORDER — TOPIRAMATE 50 MG PO TABS: 150.0000 mg | ORAL_TABLET | Freq: Every day | ORAL | 3 refills | Status: DC

## 2019-04-24 MED ORDER — GABAPENTIN 300 MG PO CAPS: 600.0000 mg | ORAL_CAPSULE | Freq: Three times a day (TID) | ORAL | 1 refills | Status: DC

## 2019-04-24 NOTE — Telephone Encounter (Signed)
Drug registry checked: tramadol last fill 02-20-19 #180.

## 2019-04-24 NOTE — Telephone Encounter (Signed)
Drug registry checked and last fill tramadol #180 02-20-19.

## 2019-04-24 NOTE — Telephone Encounter (Signed)
The Ultram prescription was sent in.

## 2019-06-27 ENCOUNTER — Other Ambulatory Visit: Payer: Self-pay | Admitting: Neurology

## 2019-06-27 MED ORDER — TRAMADOL HCL 50 MG PO TABS: ORAL_TABLET | ORAL | 0 refills | Status: DC

## 2019-06-27 NOTE — Telephone Encounter (Signed)
1) Medication(s) Requested (by name): tramadol 2) Pharmacy of Choice: [facility] [place], [place], [place] [address]

## 2019-06-29 NOTE — Progress Notes (Signed)
PATIENT: Frances Robertson DOB: Jul 26, 1940  REASON FOR VISIT: follow up HISTORY FROM: patient  HISTORY OF PRESENT ILLNESS: Today 07/03/19  Frances Robertson is an 79 year old female with history of chronic headaches.  She has a longstanding history of headache.  She has been taking Topamax, gabapentin, and tramadol for headaches. She was previously on Depakote but stopped it.  She has been prescribed Aimovig, but says she cannot give herself the shot, and the medication was too expensive. Her headaches are stable, had worsened in December following Covid.  She was treated with a Decadron taper with benefit.  She reports she continues to have daily headache to her left temple, some days are worse than others.  She takes tramadol about every 6 hours.  She says she cannot take higher doses of gabapentin due to making her head feel crazy.  She stopped Lexapro.  She works full-time at a nursing home, Environmental health practitioner.  She says her overall health has been good, sees her PCP.  HISTORY 03/29/2019 SS: Frances Robertson is a 79 year old female with history of headaches which are chronic.  She has a longstanding history of headache.  She was last seen in this office in November 2019, at that time she was taking Topamax, Depakote, and gabapentin, along with tramadol for acute headache.  She indicates her headaches have been doing well, meaning she continues to have them, but they have been mild to moderate.  She came down with Covid at the end of November.  She reports that she recovered from Covid, for the last 5 days has had a constant migraine headache.  She indicates this is her typical migraine headache.  She says the pain is located at her left temple, which is always the site of her headaches.  She denies photophobia or phonophobia, she endorses some mild nausea.  She has been taking tramadol, with some benefit, but would not completely relieve the headache.  She has recently been started on Lexapro for increased  anxiety.  She denies any falls or changes to her gait or balance.  She denies any new numbness or weakness to her arms or legs.  She denies fever.  She drives a car. She has been out of work since her Covid diagnosis. She says she has fully recovered from Covid.  She works full-time at a nursing home.  She indicates she is no longer taking Depakote.  She presents today for evaluation via telephone visit.   REVIEW OF SYSTEMS: Out of a complete 14 system review of symptoms, the patient complains only of the following symptoms, and all other reviewed systems are negative.  Headache  ALLERGIES: Allergies  Allergen Reactions  . Benzalkonium Chloride     unknown  . Hydrocodone-Acetaminophen     unknown  . Piroxicam     unknown  . Celandine [Chelidonium Majus] Nausea And Vomiting  . Celebrex [Celecoxib] Nausea And Vomiting  . Hydrocodone Other (See Comments)    sick  . Neosporin [Neomycin-Bacitracin Zn-Polymyx] Rash  . Other Rash and Other (See Comments)    Icy Hot Patches  . Oxycodone Nausea And Vomiting    HOME MEDICATIONS: Outpatient Medications Prior to Visit  Medication Sig Dispense Refill  . atenolol (TENORMIN) 50 MG tablet Take 50 mg by mouth daily.    Marland Kitchen dicyclomine (BENTYL) 10 MG capsule Take 1 capsule (10 mg total) by mouth 4 (four) times daily -  before meals and at bedtime. 30 min AC and HS 90 capsule 4  .  esomeprazole (NEXIUM) 40 MG capsule TAKE 1 CAPSULE BY MOUTH ONCE DAILY FOR 30 DAYS    . levothyroxine (SYNTHROID, LEVOTHROID) 100 MCG tablet Take 100 mcg by mouth daily before breakfast.    . topiramate (TOPAMAX) 50 MG tablet Take 3 tablets (150 mg total) by mouth at bedtime. 270 tablet 3  . traMADol (ULTRAM) 50 MG tablet TAKE 1 TABLET BY MOUTH EVERY 6 HOURS AS NEEDED MUST  LAST  60  DAYS 180 tablet 0  . zolpidem (AMBIEN) 10 MG tablet Take 10 mg by mouth at bedtime.     . gabapentin (NEURONTIN) 300 MG capsule Take 2 capsules (600 mg total) by mouth 3 (three) times daily. 540  capsule 1  . dexamethasone (DECADRON) 2 MG tablet Take 3 tablets, then take 2, then take 1 (Patient not taking: Reported on 07/03/2019) 6 tablet 0  . divalproex (DEPAKOTE ER) 500 MG 24 hr tablet Take 1 tablet (500 mg total) by mouth at bedtime. (Patient not taking: Reported on 07/03/2019) 30 tablet 2  . escitalopram (LEXAPRO) 10 MG tablet Take 10 mg by mouth daily.    Marland Kitchen FERREX 150 150 MG capsule Take 150 mg by mouth 2 (two) times daily.    . promethazine (PHENERGAN) 25 MG tablet Take 25 mg by mouth every 8 (eight) hours as needed.     No facility-administered medications prior to visit.    PAST MEDICAL HISTORY: Past Medical History:  Diagnosis Date  . Allergy   . Anemia, unspecified   . Arthritis   . Cataract   . GERD (gastroesophageal reflux disease)    takes Omeprazole daily  . Headache(784.0)    takes Topamax daily and Atenolol daily;takes Tramadol daily as well  . Insomnia    takes Ambien nightly  . Joint pain   . Joint swelling   . Low back pain    herniated disc  . PAC (premature atrial contraction)   . Pneumonia hx of-as a child  . PONV (postoperative nausea and vomiting)   . Tinnitus   . Unspecified hypothyroidism    takes Synthroid daily    PAST SURGICAL HISTORY: Past Surgical History:  Procedure Laterality Date  . CARPAL TUNNEL RELEASE Bilateral   . CATARACT EXTRACTION Bilateral   . CHOLECYSTECTOMY    . ESOPHAGOGASTRODUODENOSCOPY    . LUMBAR LAMINECTOMY/DECOMPRESSION MICRODISCECTOMY Right 07/23/2014   Procedure: Right Lumbar four-five microdiskectomy;  Surgeon: Newman Pies, MD;  Location: [facility];  Service: Neurosurgery;  Laterality: Right;  Right Lumbar four-five microdiskectomy  . TUBAL LIGATION Bilateral     FAMILY HISTORY: Family History  Problem Relation Age of Onset  . Venous thrombosis Mother 19       vague history of this vs aneurysm somewhere  . Stroke Father 80       Sisters x 2 with breast cancer, brothers x 2 died with lung cancer  .  Cancer Other   . Cancer - Lung Brother   . Cancer - Lung Brother   . Sudden death Son 71  . Colon cancer Neg Hx   . Esophageal cancer Neg Hx   . Stomach cancer Neg Hx   . Rectal cancer Neg Hx     SOCIAL HISTORY: Social History   Socioeconomic History  . Marital status: Widowed    Spouse name: Not on file  . Number of children: 2  . Years of education: 9  . Highest education level: Not on file  Occupational History  . Occupation: [employer]  Employer: [employer]    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    Tobacco Use  . Smoking status: Never Smoker  . Smokeless tobacco: Never Used  Substance and Sexual Activity  . Alcohol use: No  . Drug use: No  . Sexual activity: Not on file  Other Topics Concern  . Not on file  Social History Narrative   Lives alone.    Patient is right handed.   Patient does not drink caffeine.   Social Determinants of Health   Financial Resource Strain:   . Difficulty of Paying Living Expenses:   Food Insecurity:   . Worried About Charity fundraiser in the Last Year:   . Arboriculturist in the Last Year:   Transportation Needs:   . Film/video editor (Medical):   Marland Kitchen Lack of Transportation (Non-Medical):   Physical Activity:   . Days of Exercise per Week:   . Minutes of Exercise per Session:   Stress:   . Feeling of Stress :   Social Connections:   . Frequency of Communication with Friends and Family:   . Frequency of Social Gatherings with Friends and Family:   . Attends Religious Services:   . Active Member of Clubs or Organizations:   . Attends Archivist Meetings:   Marland Kitchen Marital Status:   Intimate Partner Violence:   . Fear of Current or Ex-Partner:   . Emotionally Abused:   Marland Kitchen Physically Abused:   . Sexually Abused:    PHYSICAL EXAM  Vitals:   07/03/19 0843  BP: (!) 151/62  Pulse: 65  Temp: (!) 97.4 F (36.3 C)  Weight: 135 lb 3.2 oz (61.3 kg)  Height: 5\' 2"  (1.575 m)   Body  mass index is 24.73 kg/m.  Generalized: Well developed, in no acute distress   Neurological examination  Mentation: Alert oriented to time, place, history taking. Follows all commands speech and language fluent Cranial nerve II-XII: Pupils were equal round reactive to light. Extraocular movements were full, visual field were full on confrontational test. Facial sensation and strength were normal. Head turning and shoulder shrug  were normal and symmetric. Motor: The motor testing reveals 5 over 5 strength of all 4 extremities. Good symmetric motor tone is noted throughout.  Sensory: Sensory testing is intact to soft touch on all 4 extremities. No evidence of extinction is noted.  Coordination: Cerebellar testing reveals good finger-nose-finger and heel-to-shin bilaterally.  Gait and station: Gait is normal. Tandem gait is normal. Romberg is negative. No drift is seen.  Reflexes: Deep tendon reflexes are symmetric and normal bilaterally.   DIAGNOSTIC DATA (LABS, IMAGING, TESTING) - I reviewed patient records, labs, notes, testing and imaging myself where available.  Lab Results  Component Value Date   WBC 4.8 07/12/2014   HGB 11.0 (L) 07/12/2014   HCT 34.4 (L) 07/12/2014   MCV 88.7 07/12/2014   PLT 168 07/12/2014      Component Value Date/Time   NA 140 07/12/2014 1126   K 4.5 07/12/2014 1126   CL 107 07/12/2014 1126   CO2 25 07/12/2014 1126   GLUCOSE 105 (H) 07/12/2014 1126   BUN 16 07/12/2014 1126   CREATININE 0.70 07/12/2014 1126   CALCIUM 9.5 07/12/2014 1126   GFRNONAA 83 (L) 07/12/2014 1126   GFRAA >90 07/12/2014 1126   No results found for: CHOL, HDL, LDLCALC, LDLDIRECT, TRIG, CHOLHDL No results found for: HGBA1C No results found for: VITAMINB12 No results found  for: TSH    ASSESSMENT AND PLAN 79 y.o. year old female  has a past medical history of Allergy, Anemia, unspecified, Arthritis, Cataract, GERD (gastroesophageal reflux disease), Headache(784.0), Insomnia,  Joint pain, Joint swelling, Low back pain, PAC (premature atrial contraction), Pneumonia (hx of-as a child), PONV (postoperative nausea and vomiting), Tinnitus, and Unspecified hypothyroidism. here with:  1.  Chronic migraine headache  Her headaches remain stable, describes daily left temporal headache that has been chronic.  She will remain on Topamax, and gabapentin.  She was previously on Depakote, but stopped it for some reason.  She is taking tramadol for acute pain about every 6 hours, we discussed, this is a alot to take that frequently, I encouraged her to taking only as needed for severe headaches, weaning down to 2-3 times a day, but seems reluctant.  She is hesitant to resume Depakote.  She will follow-up in 1 year or sooner if needed, continue follow-up with PCP.   I spent 20 minutes of face-to-face and non-face-to-face time with patient.  This included previsit chart review, lab review, study review, order entry, electronic health record documentation, patient education.    Butler Denmark, AGNP-C, DNP 07/03/2019, 9:14 AM [facility] [address] [place], [place] [address] [phone]

## 2019-07-03 ENCOUNTER — Other Ambulatory Visit: Payer: Self-pay

## 2019-07-03 ENCOUNTER — Encounter: Payer: Self-pay | Admitting: Neurology

## 2019-07-03 ENCOUNTER — Ambulatory Visit: Payer: Medicare Other | Admitting: Neurology

## 2019-07-03 VITALS — BP 151/62 | HR 65 | Temp 97.4°F | Ht 62.0 in | Wt 135.2 lb

## 2019-07-03 DIAGNOSIS — G43019 Migraine without aura, intractable, without status migrainosus: Secondary | ICD-10-CM

## 2019-07-03 MED ORDER — GABAPENTIN 300 MG PO CAPS: 600.0000 mg | ORAL_CAPSULE | Freq: Three times a day (TID) | ORAL | 1 refills | Status: DC

## 2019-07-03 NOTE — Progress Notes (Signed)
I have read the note, and I agree with the clinical assessment and plan.  Frances Robertson   

## 2019-07-03 NOTE — Patient Instructions (Signed)
Continue gabapentin, Topamax Stay off the Depakote for now  Try to cut back on the Tramadol for acute headache

## 2019-09-01 ENCOUNTER — Other Ambulatory Visit: Payer: Self-pay | Admitting: Neurology

## 2019-09-01 NOTE — Telephone Encounter (Signed)
Pt has called for a refill on her traMADol (ULTRAM) 50 MG tablet to [facility]

## 2019-09-04 ENCOUNTER — Other Ambulatory Visit: Payer: Self-pay | Admitting: Neurology

## 2019-09-04 MED ORDER — TRAMADOL HCL 50 MG PO TABS: ORAL_TABLET | ORAL | 0 refills | Status: DC

## 2019-09-04 NOTE — Telephone Encounter (Addendum)
Per [place] registry, last filled 06/27/2019 Tramadol Hcl 50 Mg Tablet #180.00 for 60 day supply by Dr. Jannifer Franklin. Pending appt 07/11/20. Pt last saw Sarah NP. Refill sent to Dr. Jannifer Franklin for review of continued Tramadol.

## 2019-10-10 ENCOUNTER — Encounter: Payer: Self-pay | Admitting: Gastroenterology

## 2019-10-10 ENCOUNTER — Telehealth: Payer: Self-pay | Admitting: Gastroenterology

## 2019-10-10 ENCOUNTER — Ambulatory Visit: Payer: Medicare Other | Admitting: Gastroenterology

## 2019-10-10 VITALS — BP 120/60 | HR 66 | Ht 62.0 in | Wt 131.0 lb

## 2019-10-10 DIAGNOSIS — R140 Abdominal distension (gaseous): Secondary | ICD-10-CM | POA: Diagnosis not present

## 2019-10-10 DIAGNOSIS — K5904 Chronic idiopathic constipation: Secondary | ICD-10-CM | POA: Diagnosis not present

## 2019-10-10 DIAGNOSIS — R1012 Left upper quadrant pain: Secondary | ICD-10-CM

## 2019-10-10 MED ORDER — HYOSCYAMINE SULFATE 0.125 MG SL SUBL: SUBLINGUAL_TABLET | SUBLINGUAL | 11 refills | Status: DC

## 2019-10-10 NOTE — Patient Instructions (Signed)
We have sent the following medications to your pharmacy for you to pick up at your convenience: hyoscyamine.   Please take over the counter Miralax daily or every other day for constipation.  Also take Gas-X four times a day for gas and bloating along with all of these regimens.    Thank you for choosing me and [facility].  Pricilla Riffle. Dagoberto Ligas., MD., [facility]

## 2019-10-10 NOTE — Progress Notes (Signed)
    History of Present Illness: This is an 79 year old female with left upper quadrant pain, abdominal bloating, gas.  She has had ongoing symptoms that have been previously evaluated.  See August 2020 office note.  CT AP in January 2020 showed a right ovarian cyst and aortic atherosclerosis.  She relates left upper quadrant pain generally following meals that lasts for an hour or so associated with gas and bloating.  She relates a bowel movement about every other day and is unsure whether she has a complete evacuation.  She rarely has diarrhea following certain foods that she generally avoids.  Dicyclomine was not effective in controlling symptoms and it led to blurred vision so it was discontinued.  IBgard did not help symptoms so it was discontinued.  She had Covid-19 in November 2020 and was treated at home.  She states she lost 8 or 10 pounds with Covid-19 however her weight has been stable since then.  No other gastrointestinal complaints.  Current Medications, Allergies, Past Medical History, Past Surgical History, Family History and Social History were reviewed in [employer] record.   Physical Exam: General: Well developed, well nourished, elderly, no acute distress Head: Normocephalic and atraumatic Eyes:  sclerae anicteric, EOMI Ears: Normal auditory acuity Mouth: Not examined, mask on during Covid-19 pandemic Lungs: Clear throughout to auscultation Heart: Regular rate and rhythm; no murmurs, rubs or bruits Abdomen: Soft, mild left upper quadrant tenderness and non distended. No masses, hepatosplenomegaly or hernias noted. Normal Bowel sounds Rectal: Not done Musculoskeletal: Symmetrical with no gross deformities  Pulses:  Normal pulses noted Extremities: No clubbing, cyanosis, edema or deformities noted Neurological: Alert oriented x 4, grossly nonfocal Psychological:  Alert and cooperative. Normal mood and affect   Assessment and Recommendations:  1.   Postprandial left upper quadrant pain, abdominal bloating, gas and mild constipation.  Prior CT AP and EGD unremarkable.  Hyoscyamine 0.125 mg 1-2 p.o. 30 minutes AC.  Gas-X 3 times daily with meals.  MiraLAX daily or every other day titrated for a complete bowel movement daily. REV in 6 weeks.  If symptoms not adequately controlled consider glycopyrrolate, CTA and repeat blood work.  2.  Right ovarian cyst.  Follow-up with PCP and GYN.

## 2019-10-11 NOTE — Telephone Encounter (Signed)
Initiated PA with [employer] and submitted on cover my meds. Waiting on a response.

## 2019-10-11 NOTE — Telephone Encounter (Signed)
Informed patient I initiated PA with cover my meds and will contact her when we recieve a answer from her insurance company. Patient verbalized understanding.

## 2019-10-11 NOTE — Telephone Encounter (Signed)
Left message for patient to return my call.

## 2019-10-12 MED ORDER — GLYCOPYRROLATE 1 MG PO TABS: 1.0000 mg | ORAL_TABLET | Freq: Two times a day (BID) | ORAL | 11 refills | Status: DC

## 2019-10-12 NOTE — Telephone Encounter (Signed)
Patient is returning your call.  

## 2019-10-12 NOTE — Telephone Encounter (Signed)
Glycopyrrolate 1 mg po bid, 1 year of refills 

## 2019-10-12 NOTE — Telephone Encounter (Signed)
Received call that hyoscamine is excluded from patient's plan. Please advise Dr. Fuller Plan if you want to switch to alternative medication.

## 2019-10-12 NOTE — Telephone Encounter (Signed)
Prescription sent to patient's pharmacy and patient notified. Patient verbalized understanding. 

## 2019-10-12 NOTE — Telephone Encounter (Signed)
Left message for patient to return my call.

## 2019-11-07 ENCOUNTER — Telehealth: Payer: Self-pay

## 2019-11-07 MED ORDER — TRAMADOL HCL 50 MG PO TABS: ORAL_TABLET | ORAL | 0 refills | Status: DC

## 2019-11-07 NOTE — Telephone Encounter (Signed)
Voicemail message, 11:36am:  Pt requesting that Judson Roch send in a prescription for Tramadol.

## 2019-11-07 NOTE — Addendum Note (Signed)
Addended by: Suzzanne Cloud on: 11/07/2019 02:44 PM   Modules accepted: Orders

## 2019-11-07 NOTE — Addendum Note (Signed)
Addended by: Elita Quick on: 11/07/2019 02:33 PM   Modules accepted: Orders

## 2019-11-09 ENCOUNTER — Telehealth: Payer: Self-pay | Admitting: Gastroenterology

## 2019-11-09 ENCOUNTER — Other Ambulatory Visit: Payer: Self-pay

## 2019-11-09 DIAGNOSIS — R140 Abdominal distension (gaseous): Secondary | ICD-10-CM

## 2019-11-09 DIAGNOSIS — R1012 Left upper quadrant pain: Secondary | ICD-10-CM

## 2019-11-09 NOTE — Telephone Encounter (Signed)
Pt has been experiencing some abod pain since yesterday. Reports that pain radiates to her back. She would like some advise.

## 2019-11-09 NOTE — Telephone Encounter (Signed)
Patient notified of CTA scheduled at [facility] for 11/22/19 2:45 arrival for 3:00.  NPO after 11:00.  She will come for the labs on Monday.

## 2019-11-09 NOTE — Telephone Encounter (Signed)
Patient reports that her pain was improved on the glycopyrrolate.  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  She was able to go to work today. She is reporting pain is still present today and has some abdominal bloating.  Per last office note you were going to consider CTA and labs.  Do you want to proceed?

## 2019-11-09 NOTE — Telephone Encounter (Signed)
CBC, CMP, lipase, TSH CT AP with CTA

## 2019-11-09 NOTE — Progress Notes (Signed)
ta

## 2019-11-13 ENCOUNTER — Other Ambulatory Visit (HOSPITAL_COMMUNITY)
Admission: RE | Admit: 2019-11-13 | Discharge: 2019-11-13 | Disposition: A | Discharge status: Home / Self Care | Payer: Medicare Other | Source: Ambulatory Visit | Attending: Gastroenterology | Admitting: Gastroenterology

## 2019-11-13 ENCOUNTER — Other Ambulatory Visit (INDEPENDENT_AMBULATORY_CARE_PROVIDER_SITE_OTHER): Payer: Medicare Other

## 2019-11-13 DIAGNOSIS — R1012 Left upper quadrant pain: Secondary | ICD-10-CM

## 2019-11-13 DIAGNOSIS — R140 Abdominal distension (gaseous): Secondary | ICD-10-CM | POA: Diagnosis not present

## 2019-11-13 LAB — COMPREHENSIVE METABOLIC PANEL
ALT: 9 U/L (ref 0–35)
AST: 18 U/L (ref 0–37)
Albumin: 4.5 g/dL (ref 3.5–5.2)
Alkaline Phosphatase: 86 U/L (ref 39–117)
BUN: 9 mg/dL (ref 6–23)
CO2: 25 mEq/L (ref 19–32)
Calcium: 9.2 mg/dL (ref 8.4–10.5)
Chloride: 106 mEq/L (ref 96–112)
Creatinine, Ser: 0.92 mg/dL (ref 0.40–1.20)
GFR: 58.67 mL/min — ABNORMAL LOW (ref >60.00)
Glucose, Bld: 98 mg/dL (ref 70–99)
Potassium: 4.4 mEq/L (ref 3.5–5.1)
Sodium: 137 mEq/L (ref 135–145)
Total Bilirubin: 0.3 mg/dL (ref 0.2–1.2)
Total Protein: 7.3 g/dL (ref 6.0–8.3)

## 2019-11-13 LAB — CBC
HCT: 34.7 % — ABNORMAL LOW (ref 36.0–46.0)
Hemoglobin: 11.4 g/dL — ABNORMAL LOW (ref 12.0–15.0)
MCHC: 32.8 g/dL (ref 30.0–36.0)
MCV: 89.3 fl (ref 78.0–100.0)
Platelets: 193 10*3/uL (ref 150.0–400.0)
RBC: 3.89 Mil/uL (ref 3.87–5.11)
RDW: 15 % (ref 11.5–15.5)
WBC: 6.2 10*3/uL (ref 4.0–10.5)

## 2019-11-13 LAB — TSH: TSH: 1.69 u[IU]/mL (ref 0.35–4.50)

## 2019-11-13 LAB — LIPASE: Lipase: 25 U/L (ref 11.0–59.0)

## 2019-11-22 ENCOUNTER — Ambulatory Visit (HOSPITAL_COMMUNITY)
Admission: RE | Admit: 2019-11-22 | Discharge: 2019-11-22 | Disposition: A | Discharge status: Home / Self Care | Payer: Medicare Other | Source: Ambulatory Visit | Attending: Gastroenterology | Admitting: Gastroenterology

## 2019-11-22 ENCOUNTER — Other Ambulatory Visit: Payer: Self-pay

## 2019-11-22 DIAGNOSIS — R140 Abdominal distension (gaseous): Secondary | ICD-10-CM | POA: Insufficient documentation

## 2019-11-22 DIAGNOSIS — R1012 Left upper quadrant pain: Secondary | ICD-10-CM | POA: Insufficient documentation

## 2019-11-22 MED ORDER — SODIUM CHLORIDE (PF) 0.9 % IJ SOLN
INTRAMUSCULAR | Status: AC
  Filled 2019-11-22: qty 50

## 2019-11-22 MED ORDER — IOHEXOL 350 MG/ML SOLN
100.0000 mL | Freq: Once | INTRAVENOUS | Status: AC | PRN
  Administered 2019-11-22: 100 mL via INTRAVENOUS

## 2019-11-28 ENCOUNTER — Telehealth: Payer: Self-pay | Admitting: Gastroenterology

## 2019-11-28 NOTE — Telephone Encounter (Signed)
Questions answered for the patient about her CT from yesterday.  She will call back for any additional questions or concerns.

## 2019-11-29 ENCOUNTER — Ambulatory Visit: Payer: Medicare Other | Admitting: Gastroenterology

## 2019-11-30 ENCOUNTER — Telehealth: Payer: Self-pay | Admitting: Gastroenterology

## 2019-11-30 NOTE — Telephone Encounter (Signed)
Patient reports that she had some pains immediately after eating nuts.  She is advised to try her dicyclomine.  She will call back for any additional questions or concerns

## 2019-12-04 ENCOUNTER — Encounter: Payer: Self-pay | Admitting: Gastroenterology

## 2019-12-04 ENCOUNTER — Ambulatory Visit: Payer: Medicare Other | Admitting: Gastroenterology

## 2019-12-04 VITALS — BP 116/78 | HR 68 | Ht 62.0 in | Wt 127.0 lb

## 2019-12-04 DIAGNOSIS — R140 Abdominal distension (gaseous): Secondary | ICD-10-CM | POA: Diagnosis not present

## 2019-12-04 DIAGNOSIS — R1012 Left upper quadrant pain: Secondary | ICD-10-CM | POA: Diagnosis not present

## 2019-12-04 DIAGNOSIS — K5904 Chronic idiopathic constipation: Secondary | ICD-10-CM | POA: Diagnosis not present

## 2019-12-04 NOTE — Patient Instructions (Signed)
Increase your Miralax to twice daily.   Change taking your dicyclomine to before meals and at bedtime.   Keep appointment with your OBGYN.   Thank you for choosing me and [facility].  Pricilla Riffle. Dagoberto Ligas., MD., Marval Regal

## 2019-12-04 NOTE — Progress Notes (Signed)
    History of Present Illness: This is an 79 year old female with constipation, abdominal bloating and LUQ pain.  She relates her symptoms have substantially improved on MiraLAX and dicyclomine.  She takes dicyclomine after meals.  She still has significant postprandial abdominal bloating which she feels is limiting her food intake.  She has a bowel movement about every other day.  Reviewed results of CTA.  Current Medications, Allergies, Past Medical History, Past Surgical History, Family History and Social History were reviewed in [employer] record.   Physical Exam: General: Well developed, well nourished, no acute distress Head: Normocephalic and atraumatic Eyes:  sclerae anicteric, EOMI Ears: Normal auditory acuity Mouth: Not examined, mask on during Covid-19 pandemic Lungs: Clear throughout to auscultation Heart: Regular rate and rhythm; no murmurs, rubs or bruits Abdomen: Soft, non tender and non distended. No masses, hepatosplenomegaly or hernias noted. Normal Bowel sounds Rectal: Not done Musculoskeletal: Symmetrical with no gross deformities  Pulses:  Normal pulses noted Extremities: No clubbing, cyanosis, edema or deformities noted Neurological: Alert oriented x 4, grossly nonfocal Psychological:  Alert and cooperative. Normal mood and affect   Assessment and Recommendations:  1. Constipation, LUQ pain, abdominal bloating. Change dicyclomine to ac and hs. Increase Miralax up to bid to achieve a daily BM. REV in 1 year.   2. Right adnexal cystic lesion. Follow up with GYN is planned.

## 2020-01-24 ENCOUNTER — Other Ambulatory Visit: Payer: Self-pay | Admitting: Neurology

## 2020-01-24 MED ORDER — TRAMADOL HCL 50 MG PO TABS
ORAL_TABLET | ORAL | 0 refills | Status: DC
Start: 2020-01-24 — End: 2020-04-16

## 2020-01-24 NOTE — Telephone Encounter (Signed)
Pt request refill traMADol (ULTRAM) 50 MG tablet at [facility]

## 2020-01-24 NOTE — Telephone Encounter (Signed)
[place] [facility] registry Verified LR:11/07/2019 Qty:160 for 60 days   Last OV:07/03/2019 Pending appointment: 07/11/2020

## 2020-02-14 ENCOUNTER — Other Ambulatory Visit: Payer: Self-pay | Admitting: Neurology

## 2020-02-14 MED ORDER — GABAPENTIN 300 MG PO CAPS
600.0000 mg | ORAL_CAPSULE | Freq: Three times a day (TID) | ORAL | 1 refills | Status: DC
Start: 2020-02-14 — End: 2020-09-05

## 2020-02-14 NOTE — Telephone Encounter (Signed)
Pt request refill gabapentin (NEURONTIN) 300 MG capsule at [facility]

## 2020-02-14 NOTE — Telephone Encounter (Signed)
Patient has FU, refill sent in.

## 2020-04-16 ENCOUNTER — Other Ambulatory Visit: Payer: Self-pay | Admitting: Neurology

## 2020-04-16 MED ORDER — TRAMADOL HCL 50 MG PO TABS
ORAL_TABLET | ORAL | 0 refills | Status: DC
Start: 2020-04-16 — End: 2020-06-19

## 2020-04-16 NOTE — Telephone Encounter (Signed)
Pt is requesting a refill for traMADol (ULTRAM) 50 MG tablet  .  Pharmacy: [id number]

## 2020-04-16 NOTE — Addendum Note (Signed)
Addended by: Guy Begin on: 04/16/2020 04:09 PM   Modules accepted: Orders

## 2020-04-17 ENCOUNTER — Telehealth: Payer: Self-pay | Admitting: Neurology

## 2020-04-17 MED ORDER — TOPIRAMATE 50 MG PO TABS: 150.0000 mg | ORAL_TABLET | Freq: Every day | ORAL | 3 refills | Status: DC

## 2020-04-17 NOTE — Addendum Note (Signed)
Addended by: Guy Begin on: 04/17/2020 04:05 PM   Modules accepted: Orders

## 2020-04-17 NOTE — Telephone Encounter (Signed)
Pt. called to request a refill for topiramate (TOPAMAX) 50 MG tablet.  Pharmacy: [facility] [phone]

## 2020-05-16 ENCOUNTER — Telehealth: Payer: Self-pay | Admitting: Neurology

## 2020-05-16 NOTE — Telephone Encounter (Signed)
Agree with your plan, hard to exactly what is going on. Good to review signs of stroke. Has chronic headaches, if feels different, is concerned, should likely be seen by PCP if it does not improve. Urgent care, ER if emergency.

## 2020-05-16 NOTE — Telephone Encounter (Signed)
Called pt and relayed per SS/NP message.  If she continues with spells, concerned to see pcp.  Also urgent care and ER options for acute emerg sx.  She verbalized understanding. (one was when working the other was out grocery shopping).  She will keep diary when has spells as well.  She appreciated call back.

## 2020-05-16 NOTE — Telephone Encounter (Signed)
I called pt.  She is c/o of some weird feeling in her head x 2, hard to describe last for about 2 minutes.  No dizziness, no pain, felt like she was going to fall. (but did not).  ? If having stroke.  I went over s/s stroke. I told her that was not sure what she was experiencing.   She had been to ob/gyn recently and had labs all ok.  Had nose bleed yesterday she never has nose bleeds.  She said she does not have high Bp.  I instructed that could see pcp for evaluation, but would send message to SS/NP.   Not sure ? Migraines??

## 2020-05-16 NOTE — Telephone Encounter (Signed)
Pt. states hip feels weird & it makes her feel like she's going to fall. She asks why is it doing this. Please advise.

## 2020-06-19 ENCOUNTER — Other Ambulatory Visit: Payer: Self-pay | Admitting: Neurology

## 2020-06-19 MED ORDER — TRAMADOL HCL 50 MG PO TABS
ORAL_TABLET | ORAL | 0 refills | Status: DC
Start: 2020-06-19 — End: 2020-08-13

## 2020-06-19 NOTE — Telephone Encounter (Signed)
Pt is needing an refill on her traMADol (ULTRAM) 50 MG tablet sent in to the [facility] in [place] [address].

## 2020-06-19 NOTE — Telephone Encounter (Signed)
I've talked to her about her frequent use of Tramadol, she usually gets #180 tablets for 60 days. I have sent in 90 tablets this time, she has appointment with Dr. Jannifer Franklin end of the month, incase treatment plan changes.

## 2020-07-11 ENCOUNTER — Encounter: Payer: Self-pay | Admitting: Neurology

## 2020-07-11 ENCOUNTER — Ambulatory Visit: Payer: Medicare Other | Admitting: Neurology

## 2020-07-11 ENCOUNTER — Other Ambulatory Visit: Payer: Self-pay

## 2020-07-11 VITALS — BP 136/76 | HR 77 | Ht 62.0 in | Wt 122.8 lb

## 2020-07-11 DIAGNOSIS — G43019 Migraine without aura, intractable, without status migrainosus: Secondary | ICD-10-CM | POA: Diagnosis not present

## 2020-07-11 NOTE — Progress Notes (Signed)
Reason for visit: Migraine headache  Frances Robertson is an 80 y.o. female  History of present illness:  Ms. Cargile is an 80 year old right-handed white female with a history of intractable headache.  The patient is having a dull headache daily, she takes Ultram, 1 tablet twice daily for the headache.  The patient is on Topamax taking 150 mg in the evening and she takes 600 mg gabapentin 3 times daily.  She is sleeping well, she is not having any severe headaches.  She is still working and she is not missing work.  She is apparently satisfied with her current treatment.  The patient indicates that her headaches are mainly in the frontotemporal regions but are worse on the left temporal area.  The headaches tend to be a bit less severe in the morning, worse as the day goes on.  She denies any nausea or vomiting or any photophobia or phonophobia.  Past Medical History:  Diagnosis Date  . Allergy   . Anemia, unspecified   . Arthritis   . Cataract   . GERD (gastroesophageal reflux disease)    takes Omeprazole daily  . Headache(784.0)    takes Topamax daily and Atenolol daily;takes Tramadol daily as well  . Insomnia    takes Ambien nightly  . Joint pain   . Joint swelling   . Low back pain    herniated disc  . PAC (premature atrial contraction)   . Pneumonia hx of-as a child  . PONV (postoperative nausea and vomiting)   . Tinnitus   . Unspecified hypothyroidism    takes Synthroid daily    Past Surgical History:  Procedure Laterality Date  . CARPAL TUNNEL RELEASE Bilateral   . CATARACT EXTRACTION Bilateral   . CHOLECYSTECTOMY    . ESOPHAGOGASTRODUODENOSCOPY    . LUMBAR LAMINECTOMY/DECOMPRESSION MICRODISCECTOMY Right 07/23/2014   Procedure: Right Lumbar four-five microdiskectomy;  Surgeon: Newman Pies, MD;  Location: [facility];  Service: Neurosurgery;  Laterality: Right;  Right Lumbar four-five microdiskectomy  . TUBAL LIGATION Bilateral     Family History  Problem  Relation Age of Onset  . Venous thrombosis Mother 26       vague history of this vs aneurysm somewhere  . Heart attack Mother   . Stroke Father 64       Sisters x 2 with breast cancer, brothers x 2 died with lung cancer  . Breast cancer Sister   . Cancer - Lung Brother        smoker  . Cancer - Lung Brother        smoker   . Sudden death Son 38       stroke  . Colon cancer Neg Hx   . Esophageal cancer Neg Hx   . Stomach cancer Neg Hx   . Rectal cancer Neg Hx   . Pancreatic cancer Neg Hx     Social history:  reports that she has never smoked. She has never used smokeless tobacco. She reports that she does not drink alcohol and does not use drugs.    Allergies  Allergen Reactions  . Benzalkonium Chloride     unknown  . Hydrocodone-Acetaminophen     unknown  . Piroxicam     unknown  . Celandine [Chelidonium Majus] Nausea And Vomiting  . Celebrex [Celecoxib] Nausea And Vomiting  . Hydrocodone Other (See Comments)    sick  . Neosporin [Neomycin-Bacitracin Zn-Polymyx] Rash  . Other Rash and Other (See Comments)  Icy Hot Patches  . Oxycodone Nausea And Vomiting    Medications:  Prior to Admission medications   Medication Sig Start Date End Date Taking? Authorizing Provider  atenolol (TENORMIN) 50 MG tablet Take 50 mg by mouth daily.   Yes [provider]  dicyclomine (BENTYL) 10 MG capsule Take 1 capsule (10 mg total) by mouth 4 (four) times daily -  before meals and at bedtime. 30 min AC and HS 01/02/19  Yes Ladene Artist, MD  esomeprazole (NEXIUM) 40 MG capsule TAKE 1 CAPSULE BY MOUTH ONCE DAILY FOR 30 DAYS 06/24/18  Yes [provider]  gabapentin (NEURONTIN) 300 MG capsule Take 2 capsules (600 mg total) by mouth 3 (three) times daily. 02/14/20  Yes Suzzanne Cloud, NP  glycopyrrolate (ROBINUL) 1 MG tablet Take 1 tablet (1 mg total) by mouth 2 (two) times daily. 10/12/19  Yes Ladene Artist, MD  levothyroxine (SYNTHROID, LEVOTHROID) 100 MCG tablet Take  100 mcg by mouth daily before breakfast.   Yes [provider]  polyethylene glycol (MIRALAX / GLYCOLAX) 17 g packet Take 17 g by mouth daily.   Yes [provider]  topiramate (TOPAMAX) 50 MG tablet Take 3 tablets (150 mg total) by mouth at bedtime. 04/17/20  Yes Suzzanne Cloud, NP  traMADol (ULTRAM) 50 MG tablet TAKE 1 TABLET BY MOUTH EVERY 6 HOURS AS NEEDED (1 month rx) 06/19/20  Yes Suzzanne Cloud, NP  zolpidem (AMBIEN) 10 MG tablet Take 10 mg by mouth at bedtime.    Yes [provider]    ROS:  Out of a complete 14 system review of symptoms, the patient complains only of the following symptoms, and all other reviewed systems are negative.  Headache  Blood pressure 136/76, pulse 77, height 5\' 2"  (1.575 m), weight 122 lb 12.8 oz (55.7 kg).  Physical Exam  General: The patient is alert and cooperative at the time of the examination.  Skin: No significant peripheral edema is noted.   Neurologic Exam  Mental status: The patient is alert and oriented x 3 at the time of the examination. The patient has apparent normal recent and remote memory, with an apparently normal attention span and concentration ability.   Cranial nerves: Facial symmetry is present. Speech is normal, no aphasia or dysarthria is noted. Extraocular movements are full. Visual fields are full.  Motor: The patient has good strength in all 4 extremities.  Sensory examination: Soft touch sensation is symmetric on the face, arms, and legs.  Coordination: The patient has good finger-nose-finger and heel-to-shin bilaterally.  Gait and station: The patient has a normal gait. Tandem gait is normal. Romberg is negative. No drift is seen.  Reflexes: Deep tendon reflexes are symmetric.   Assessment/Plan:  1.  Chronic daily headache  The patient is having mild daily headaches but she is able to cope with this quite well.  We will continue her current medication regimen, she will call when she  needs a refill.  She will follow up here in 6 months.  Jill Alexanders MD 07/11/2020 1:19 PM  [facility] [address] [place], [place] [address]  Phone [phone] Fax [phone]

## 2020-08-13 ENCOUNTER — Other Ambulatory Visit: Payer: Self-pay | Admitting: Neurology

## 2020-08-13 NOTE — Telephone Encounter (Signed)
Patient is up-to-date on her appointments.  Patient is due for a refill on tramadol.  [place] Controlled Substance Registry has been checked and is appropriate.

## 2020-09-05 ENCOUNTER — Other Ambulatory Visit: Payer: Self-pay | Admitting: Emergency Medicine

## 2020-09-05 ENCOUNTER — Telehealth: Payer: Self-pay | Admitting: Neurology

## 2020-09-05 MED ORDER — GABAPENTIN 300 MG PO CAPS
600.0000 mg | ORAL_CAPSULE | Freq: Three times a day (TID) | ORAL | 1 refills | Status: DC
Start: 2020-09-05 — End: 2021-03-25

## 2020-09-05 NOTE — Telephone Encounter (Signed)
Pt request refill gabapentin (NEURONTIN) 300 MG capsule at [facility]

## 2020-10-24 ENCOUNTER — Other Ambulatory Visit: Payer: Self-pay | Admitting: Neurology

## 2020-12-26 ENCOUNTER — Other Ambulatory Visit: Payer: Self-pay | Admitting: Neurology

## 2021-01-07 ENCOUNTER — Other Ambulatory Visit: Payer: Self-pay | Admitting: Gastroenterology

## 2021-01-08 ENCOUNTER — Other Ambulatory Visit: Payer: Self-pay | Admitting: Gastroenterology

## 2021-01-08 ENCOUNTER — Ambulatory Visit: Payer: Medicare Other | Admitting: Gastroenterology

## 2021-01-08 ENCOUNTER — Encounter: Payer: Self-pay | Admitting: Gastroenterology

## 2021-01-08 VITALS — BP 150/78 | HR 96 | Ht 62.0 in | Wt 122.2 lb

## 2021-01-08 DIAGNOSIS — R1013 Epigastric pain: Secondary | ICD-10-CM | POA: Diagnosis not present

## 2021-01-08 DIAGNOSIS — K219 Gastro-esophageal reflux disease without esophagitis: Secondary | ICD-10-CM

## 2021-01-08 MED ORDER — ESOMEPRAZOLE MAGNESIUM 40 MG PO CPDR: DELAYED_RELEASE_CAPSULE | ORAL | 11 refills | Status: DC

## 2021-01-08 NOTE — Patient Instructions (Signed)
We have sent the following medications to your pharmacy for you to pick up at your convenience: esomeprazole 40 mg twice daily.   Call if your symptoms return.  Due to recent changes in healthcare laws, you may see the results of your imaging and laboratory studies on MyChart before your provider has had a chance to review them.  We understand that in some cases there may be results that are confusing or concerning to you. Not all laboratory results come back in the same time frame and the provider may be waiting for multiple results in order to interpret others.  Please give [place] 48 hours in order for your provider to thoroughly review all the results before contacting the office for clarification of your results.   The [facility] providers would like to encourage you to use [facility] to communicate with providers for non-urgent requests or questions.  Due to long hold times on the telephone, sending your provider a message by [facility] may be a faster and more efficient way to get a response.  Please allow 48 business hours for a response.  Please remember that this is for non-urgent requests.   Thank you for choosing me and [facility].  Pricilla Riffle. Dagoberto Ligas., MD., Marval Regal

## 2021-01-08 NOTE — Progress Notes (Signed)
    History of Present Illness: This is an 80 year old female with epigastric pain and GERD.  She presented to the [facility] ED in [place] with worsening epigastric pain.  She relates she was prescribed sucralfate, famotidine and cephalexin.  After several days she developed a rash.  She states she was evaluated by her PCP and an urgent care center.  She relates she was given medication for the rash she feels it was a corticosteroid.  Sucralfate, famotidine and cephalexin were discontinued.  Her Nexium was increased from 40 mg daily to 40 mg twice daily.  Unfortunately I do not have the records from the ED, urgent care center or her PCP available today except for a CTA abdomen and pelvis that showed a small hiatal hernia, mild celiac atherosclerosis, mild SMA atherosclerosis IMA was patent, prior cholecystectomy, right adnexal cystic lesion, chronic L2 compression fracture, mild aortic atherosclerosis.  Since beginning Nexium twice daily her epigastric pain and reflux symptoms are under very good control.  When she reduces to once daily her symptoms return.  Current Medications, Allergies, Past Medical History, Past Surgical History, Family History and Social History were reviewed in [employer] record.   Physical Exam: General: Well developed, well nourished, no acute distress Head: Normocephalic and atraumatic Eyes: Sclerae anicteric, EOMI Ears: Normal auditory acuity Mouth: Not examined, mask on during Covid-19 pandemic Lungs: Clear throughout to auscultation Heart: Regular rate and rhythm; no murmurs, rubs or bruits Abdomen: Soft, non tender and non distended. No masses, hepatosplenomegaly or hernias noted. Normal Bowel sounds Rectal: Not done Musculoskeletal: Symmetrical with no gross deformities  Pulses:  Normal pulses noted Extremities: No clubbing, cyanosis, edema or deformities noted Neurological: Alert oriented x 4, grossly nonfocal Psychological:  Alert  and cooperative. Normal mood and affect   Assessment and Recommendations:  GERD, epigastric pain.  Small hiatal hernia.  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  Suspected refractory GERD causing her symptoms.  Rule out ulcer, esophagitis.  We discussed the options of continuing Nexium 40 mg twice daily and proceeding with EGD to further evaluate.  She declines EGD at this time.  She prefers to remain on twice daily and antireflux measures.  If her symptoms are not well controlled on Nexium 40 mg twice daily I have advised her to contact us to schedule EGD. REV in 1 year.

## 2021-01-10 ENCOUNTER — Telehealth: Payer: Self-pay

## 2021-01-10 ENCOUNTER — Other Ambulatory Visit: Payer: Self-pay

## 2021-01-10 MED ORDER — ESOMEPRAZOLE MAGNESIUM 40 MG PO CPDR: 40.0000 mg | DELAYED_RELEASE_CAPSULE | Freq: Two times a day (BID) | ORAL | 11 refills | Status: AC

## 2021-01-10 NOTE — Telephone Encounter (Signed)
Received faxed approval for quantity limit request for esomeprazole 40 mg twice daily.The authorization for coverage of the drug will end on 01/09/22.

## 2021-01-21 ENCOUNTER — Ambulatory Visit: Payer: Medicare Other | Admitting: Neurology

## 2021-01-21 ENCOUNTER — Encounter: Payer: Self-pay | Admitting: Neurology

## 2021-01-21 ENCOUNTER — Telehealth: Payer: Self-pay

## 2021-01-21 VITALS — BP 134/79 | HR 66 | Ht 62.0 in | Wt 123.0 lb

## 2021-01-21 DIAGNOSIS — G43019 Migraine without aura, intractable, without status migrainosus: Secondary | ICD-10-CM

## 2021-01-21 MED ORDER — NURTEC 75 MG PO TBDP: 75.0000 mg | ORAL_TABLET | ORAL | 3 refills | Status: DC

## 2021-01-21 NOTE — Telephone Encounter (Signed)
Approved. Effective from 01/21/2021 through 01/21/2022.

## 2021-01-21 NOTE — Telephone Encounter (Signed)
I submitted a PA request on CMM, Key: [id number]. Awaiting determination from [place].

## 2021-01-21 NOTE — Progress Notes (Signed)
Reason for visit: Headache  Frances Robertson is an 80 y.o. female  History of present illness:  Frances Robertson is an 80 year old right-handed white female with a history of intractable headaches.  The patient generally has some headache almost every day, over the last several weeks she has had some increase in her left temporal headache.  She may have some phonophobia without photophobia, she does not have a lot of nausea or vomiting.  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  She is staying active.  She is on Topamax and gabapentin, she is also on a beta-blocker.  She takes Ultram if needed.  She returns to the office today for an evaluation.  Past Medical History:  Diagnosis Date   Allergy    Anemia, unspecified    Arthritis    Cataract    GERD (gastroesophageal reflux disease)    takes Omeprazole daily   Headache(784.0)    takes Topamax daily and Atenolol daily;takes Tramadol daily as well   Insomnia    takes Ambien nightly   Joint pain    Joint swelling    Low back pain    herniated disc   PAC (premature atrial contraction)    Pneumonia hx of-as a child   PONV (postoperative nausea and vomiting)    Tinnitus    Unspecified hypothyroidism    takes Synthroid daily    Past Surgical History:  Procedure Laterality Date   CARPAL TUNNEL RELEASE Bilateral    CATARACT EXTRACTION Bilateral    CHOLECYSTECTOMY     ESOPHAGOGASTRODUODENOSCOPY     LUMBAR LAMINECTOMY/DECOMPRESSION MICRODISCECTOMY Right 07/23/2014   Procedure: Right Lumbar four-five microdiskectomy;  Surgeon: Newman Pies, MD;  Location: [facility];  Service: Neurosurgery;  Laterality: Right;  Right Lumbar four-five microdiskectomy   TUBAL LIGATION Bilateral     Family History  Problem Relation Age of Onset   Venous thrombosis Mother 51       vague history of this vs aneurysm somewhere   Heart attack Mother    Stroke Father 58       Sisters x 2 with breast cancer, brothers x 2 died with lung cancer    Breast cancer Sister    Cancer - Lung Brother        smoker   Cancer - Lung Brother        smoker    Sudden death Son 48       stroke   Colon cancer Neg Hx    Esophageal cancer Neg Hx    Stomach cancer Neg Hx    Rectal cancer Neg Hx    Pancreatic cancer Neg Hx     Social history:  reports that she has never smoked. She has never used smokeless tobacco. She reports that she does not drink alcohol and does not use drugs.    Allergies  Allergen Reactions   Benzalkonium Chloride     unknown   Hydrocodone-Acetaminophen     unknown   Piroxicam     unknown   Celandine [Chelidonium Majus] Nausea And Vomiting   Celebrex [Celecoxib] Nausea And Vomiting   Cephalexin Rash   Famotidine Rash   Hydrocodone Other (See Comments)    sick   Neosporin [Neomycin-Bacitracin Zn-Polymyx] Rash   Other Rash and Other (See Comments)    Icy Hot Patches   Oxycodone Nausea And Vomiting   Sucralfate Rash    Medications:  Prior to Admission medications   Medication Sig Start Date  End Date Taking? Authorizing Provider  atenolol (TENORMIN) 50 MG tablet Take 50 mg by mouth daily.   Yes [provider]  dicyclomine (BENTYL) 10 MG capsule Take 1 capsule (10 mg total) by mouth 4 (four) times daily -  before meals and at bedtime. 30 min AC and HS 01/02/19  Yes Ladene Artist, MD  esomeprazole (NEXIUM) 40 MG capsule Take 1 capsule (40 mg total) by mouth 2 (two) times daily. 01/10/21  Yes Ladene Artist, MD  gabapentin (NEURONTIN) 300 MG capsule Take 2 capsules (600 mg total) by mouth 3 (three) times daily. 09/05/20  Yes Kathrynn Ducking, MD  glycopyrrolate (ROBINUL) 1 MG tablet Take 1 tablet by mouth twice daily 01/07/21  Yes Ladene Artist, MD  levothyroxine (SYNTHROID, LEVOTHROID) 100 MCG tablet Take 100 mcg by mouth daily before breakfast.   Yes [provider]  polyethylene glycol (MIRALAX / GLYCOLAX) 17 g packet Take 17 g by mouth daily.   Yes [provider]  topiramate  (TOPAMAX) 50 MG tablet Take 3 tablets (150 mg total) by mouth at bedtime. 04/17/20  Yes Suzzanne Cloud, NP  traMADol (ULTRAM) 50 MG tablet TAKE 1 TABLET BY MOUTH EVERY 6 HOURS AS NEEDED MUST  LAST  60  DAYS 12/30/20  Yes Kathrynn Ducking, MD  zolpidem (AMBIEN) 10 MG tablet Take 10 mg by mouth at bedtime.    Yes [provider]    ROS:  Out of a complete 14 system review of symptoms, the patient complains only of the following symptoms, and all other reviewed systems are negative.  Headache   Blood pressure 134/79, pulse 66, height 5\' 2"  (1.575 m), weight 123 lb (55.8 kg).  Physical Exam  General: The patient is alert and cooperative at the time of the examination.  Skin: No significant peripheral edema is noted.   Neurologic Exam  Mental status: The patient is alert and oriented x 3 at the time of the examination. The patient has apparent normal recent and remote memory, with an apparently normal attention span and concentration ability.   Cranial nerves: Facial symmetry is present. Speech is normal, no aphasia or dysarthria is noted. Extraocular movements are full. Visual fields are full.  Motor: The patient has good strength in all 4 extremities.  Sensory examination: Soft touch sensation is symmetric on the face, arms, and legs.  Coordination: The patient has good finger-nose-finger and heel-to-shin bilaterally.  Gait and station: The patient has a normal gait. Tandem gait is slightly unsteady.  Romberg is negative. No drift is seen.  Reflexes: Deep tendon reflexes are symmetric.   Assessment/Plan:  1.  Intractable headache  The patient continues to have ongoing headache issues.  We will consider changing medications at this time.  The patient wishes to come off of the Topamax.  I have recommended going on Ajovy or Emgality, but she does not want a medication that is injectable.  I will try oral Nurtec taking 75 mg every other day.  If we can get the medication  approved, we will taper off of the Topamax by 50 mg every 2 weeks.  The patient will follow-up in 4 months, she can be seen through Dr. Billey Gosling.  Jill Alexanders MD 01/21/2021 10:33 AM  [facility] [address] [place], [place] [address]  Phone [phone] Fax [phone]

## 2021-02-24 ENCOUNTER — Other Ambulatory Visit: Payer: Self-pay | Admitting: Psychiatry

## 2021-02-24 NOTE — Telephone Encounter (Signed)
I'll only refill enough Tramadol to wean the medication off but will not do recurring refills. If she's okay with this plan I can send in a refill with a tapering schedule. Otherwise I'll have to send her a referral for pain management if she wants to continue the Tramadol

## 2021-02-24 NOTE — Telephone Encounter (Signed)
Are you willing to continue refilling this drug?  [place] drug registry checked, last filled 12/30/20, pt last seen with Dr Jannifer Franklin 01/21/21. Advised to FU in 4 months with Dr Billey Gosling. Has upcoming appt 05/27/20. REFILL REQUEST IS 5 DAYS EARLY, possible to refill on Thursday.  Please advise

## 2021-02-24 NOTE — Telephone Encounter (Signed)
Pt has called for a refill on her traMADol (ULTRAM) 50 MG tablet  to [facility]

## 2021-02-24 NOTE — Telephone Encounter (Signed)
Cannot fill until 11/19

## 2021-02-27 NOTE — Telephone Encounter (Signed)
Contacted pt twice, first call hung up. Pt asnwered the second call and stated she cant answer the phone and hung up.  Need to discuss Rx tramadol, will contact again later

## 2021-02-27 NOTE — Telephone Encounter (Signed)
Pt returned my call, went over Dr Georgina Peer recommendations, she would rather get a tapering dose than going to pain management, doesn think they will do her any good.  Please advise.

## 2021-02-28 ENCOUNTER — Other Ambulatory Visit: Payer: Self-pay | Admitting: Psychiatry

## 2021-02-28 MED ORDER — TRAMADOL HCL 50 MG PO TABS: ORAL_TABLET | ORAL | 0 refills | Status: AC

## 2021-02-28 NOTE — Telephone Encounter (Signed)
Rx for taper sent to her pharmacy

## 2021-03-25 ENCOUNTER — Telehealth: Payer: Self-pay | Admitting: Psychiatry

## 2021-03-25 MED ORDER — GABAPENTIN 300 MG PO CAPS: 600.0000 mg | ORAL_CAPSULE | Freq: Three times a day (TID) | ORAL | 1 refills | Status: AC

## 2021-03-25 NOTE — Telephone Encounter (Signed)
Refill has been sent.  °

## 2021-03-25 NOTE — Telephone Encounter (Signed)
Pt requesting refill for gabapentin (NEURONTIN) 300 MG capsule. [facility] [address] [place] - [address].

## 2021-05-27 ENCOUNTER — Other Ambulatory Visit: Payer: Self-pay

## 2021-05-27 ENCOUNTER — Encounter: Payer: Self-pay | Admitting: Psychiatry

## 2021-05-27 ENCOUNTER — Ambulatory Visit: Payer: Medicare Other | Admitting: Psychiatry

## 2021-05-27 VITALS — BP 160/87 | HR 67 | Ht 62.0 in | Wt 125.0 lb

## 2021-05-27 DIAGNOSIS — G43709 Chronic migraine without aura, not intractable, without status migrainosus: Secondary | ICD-10-CM | POA: Diagnosis not present

## 2021-05-27 NOTE — Progress Notes (Signed)
° °  CC:  headaches  Follow-up Visit  Last visit: 01/21/21  Brief HPI: 81 year old female with a history of GERD, hypothyroidism who follows in clinic for migraines.  At her last visit she was prescribed Nurtec every other day for prevention with the plan to wean Topamax.  Interval History: Currently she only has intermittent dull headaches but otherwise feels headaches are under good control. She did not start Nurtec and has continued with Topamax and gabapentin for prevention. She does not have side effects with this medication and is happy with her current headache control.  Current Headache Regimen: Topamax 150 mg QHS, gabapentin 600 mg TID   Prior Therapies                                  Topamax 150 mg QHS Gabapentin 600 mg TID Atenolol 50 mg daily Effexor 37.5 mg daily - blurred vision Tramadol Nurtec  Physical Exam:   Vital Signs: BP (!) 160/87    Pulse 67    Ht 5\' 2"  (1.575 m)    Wt 125 lb (56.7 kg)    BMI 22.86 kg/m  GENERAL:  well appearing, in no acute distress, alert  SKIN:  Color, texture, turgor normal. No rashes or lesions HEAD:  Normocephalic/atraumatic. RESP: normal respiratory effort MSK:  No gross joint deformities.   NEUROLOGICAL: Mental Status: Alert, oriented to person, place and time, Follows commands, and Speech fluent and appropriate. Cranial Nerves: PERRL, face symmetric, no dysarthria, hearing grossly intact Motor: moves all extremities equally Gait: normal-based.  IMPRESSION: 81 year old female with a history of GERD, hypothyroidism who presents for follow up of migraines. She feels her headaches are currently well-controlled with Topamax and gabapentin for headache prevention. Will continue current regimen for now.  PLAN: -Prevention: Continue Topamax 150 mg QHS, gabapentin 600 mg TID  Follow-up: 1 year or sooner if needed  I spent a total of 28 minutes on the date of the service. Discussed medication side effects, adverse reactions and  drug interactions.   Genia Harold, MD 05/27/21 11:32 AM

## 2021-05-28 ENCOUNTER — Other Ambulatory Visit (INDEPENDENT_AMBULATORY_CARE_PROVIDER_SITE_OTHER): Payer: Medicare Other

## 2021-05-28 ENCOUNTER — Encounter: Payer: Self-pay | Admitting: Gastroenterology

## 2021-05-28 ENCOUNTER — Ambulatory Visit: Payer: Medicare Other | Admitting: Gastroenterology

## 2021-05-28 VITALS — BP 124/66 | HR 81 | Ht 62.0 in | Wt 126.1 lb

## 2021-05-28 DIAGNOSIS — R1013 Epigastric pain: Secondary | ICD-10-CM

## 2021-05-28 LAB — CBC WITH DIFFERENTIAL/PLATELET
Basophils Absolute: 0 10*3/uL (ref 0.0–0.1)
Basophils Relative: 1 % (ref 0.0–3.0)
Eosinophils Absolute: 0.2 10*3/uL (ref 0.0–0.7)
Eosinophils Relative: 3.3 % (ref 0.0–5.0)
HCT: 33.1 % — ABNORMAL LOW (ref 36.0–46.0)
Hemoglobin: 10.6 g/dL — ABNORMAL LOW (ref 12.0–15.0)
Lymphocytes Relative: 35.4 % (ref 12.0–46.0)
Lymphs Abs: 1.8 10*3/uL (ref 0.7–4.0)
MCHC: 32.1 g/dL (ref 30.0–36.0)
MCV: 87.4 fl (ref 78.0–100.0)
Monocytes Absolute: 0.5 10*3/uL (ref 0.1–1.0)
Monocytes Relative: 9.8 % (ref 3.0–12.0)
Neutro Abs: 2.5 10*3/uL (ref 1.4–7.7)
Neutrophils Relative %: 50.5 % (ref 43.0–77.0)
Platelets: 192 10*3/uL (ref 150.0–400.0)
RBC: 3.79 Mil/uL — ABNORMAL LOW (ref 3.87–5.11)
RDW: 15.5 % (ref 11.5–15.5)
WBC: 4.9 10*3/uL (ref 4.0–10.5)

## 2021-05-28 NOTE — Progress Notes (Signed)
° ° °  History of Present Illness: This is an 81 year old female complaining of epigastric pain.  Her symptoms have been present for several months.  They occur intermittently with no clear pattern.  Sometimes symptoms are improved with meals and sometimes symptoms are exacerbated by meals.  She describes it as an achy pain.  It generally lasts a few hours at a time and then resolves.  Tums do not seem to improve her symptoms.  On 1 occasion her symptoms lasted over a day.  She has noted a slight weight gain over the past few months, up 4 pounds since September. CT angiogram in August 2021 showed patent mesenteric arteries with no evidence of occlusion and no narrowings to suggest chronic mesenteric ischemia.  A 4.6 cm right adnexal cystic lesion was noted as well and she was advised to follow-up with GYN.  Current Medications, Allergies, Past Medical History, Past Surgical History, Family History and Social History were reviewed in [employer] record.   Physical Exam: General: Well developed, well nourished, no acute distress Head: Normocephalic and atraumatic Eyes: Sclerae anicteric, EOMI Ears: Normal auditory acuity Mouth: Not examined, mask on during Covid-19 pandemic Lungs: Clear throughout to auscultation Heart: Regular rate and rhythm; no murmurs, rubs or bruits Abdomen: Soft, non tender and non distended. No masses, hepatosplenomegaly or hernias noted. Normal Bowel sounds Rectal: Not done Musculoskeletal: Symmetrical with no gross deformities  Pulses:  Normal pulses noted Extremities: No clubbing, cyanosis, edema or deformities noted Neurological: Alert oriented x 4, grossly nonfocal Psychological:  Alert and cooperative. Normal mood and affect   Assessment and Recommendations:  Chronic intermittent epigastric pain. Change Nexium to 40 mg po bid. TUMS prn. CBC, CMP, lipase. Schedule EGD. The risks (including bleeding, perforation, infection, missed lesions,  medication reactions and possible hospitalization or surgery if complications occur), benefits, and alternatives to endoscopy with possible biopsy and possible dilation were discussed with the patient and they consent to proceed.  Consider RUQ [place] if EGD is negative.

## 2021-05-28 NOTE — Patient Instructions (Signed)
Your provider has requested that you go to the basement level for lab work before leaving today. Press "B" on the elevator. The lab is located at the first door on the left as you exit the elevator.  Please take your Nexium 30 minutes before breakfast and dinner twice daily.   You have been scheduled for an endoscopy. Please follow written instructions given to you at your visit today. If you use inhalers (even only as needed), please bring them with you on the day of your procedure.  The [facility] providers would like to encourage you to use [facility] to communicate with providers for non-urgent requests or questions.  Due to long hold times on the telephone, sending your provider a message by [facility] may be a faster and more efficient way to get a response.  Please allow 48 business hours for a response.  Please remember that this is for non-urgent requests.   Due to recent changes in healthcare laws, you may see the results of your imaging and laboratory studies on MyChart before your provider has had a chance to review them.  We understand that in some cases there may be results that are confusing or concerning to you. Not all laboratory results come back in the same time frame and the provider may be waiting for multiple results in order to interpret others.  Please give [place] 48 hours in order for your provider to thoroughly review all the results before contacting the office for clarification of your results.   Thank you for choosing me and [facility].  Pricilla Riffle. Dagoberto Ligas., MD., [facility] Regal

## 2021-05-29 ENCOUNTER — Other Ambulatory Visit: Payer: Self-pay

## 2021-05-29 ENCOUNTER — Other Ambulatory Visit (INDEPENDENT_AMBULATORY_CARE_PROVIDER_SITE_OTHER): Payer: Medicare Other

## 2021-05-29 DIAGNOSIS — D649 Anemia, unspecified: Secondary | ICD-10-CM | POA: Diagnosis not present

## 2021-05-29 LAB — COMPREHENSIVE METABOLIC PANEL
ALT: 8 U/L (ref 0–35)
AST: 17 U/L (ref 0–37)
Albumin: 4.3 g/dL (ref 3.5–5.2)
Alkaline Phosphatase: 82 U/L (ref 39–117)
BUN: 13 mg/dL (ref 6–23)
CO2: 29 mEq/L (ref 19–32)
Calcium: 9.1 mg/dL (ref 8.4–10.5)
Chloride: 105 mEq/L (ref 96–112)
Creatinine, Ser: 0.99 mg/dL (ref 0.40–1.20)
GFR: 53.29 mL/min — ABNORMAL LOW (ref >60.00)
Glucose, Bld: 89 mg/dL (ref 70–99)
Potassium: 4.6 mEq/L (ref 3.5–5.1)
Sodium: 137 mEq/L (ref 135–145)
Total Bilirubin: 0.3 mg/dL (ref 0.2–1.2)
Total Protein: 7.1 g/dL (ref 6.0–8.3)

## 2021-05-29 LAB — FOLATE: Folate: >24.2 ng/mL (ref >5.9)

## 2021-05-29 LAB — IBC + FERRITIN
Ferritin: 30.9 ng/mL (ref 10.0–291.0)
Iron: 47 ug/dL (ref 42–145)
Saturation Ratios: 13.4 % — ABNORMAL LOW (ref 20.0–50.0)
TIBC: 351.4 ug/dL (ref 250.0–450.0)
Transferrin: 251 mg/dL (ref 212.0–360.0)

## 2021-05-29 LAB — LIPASE: Lipase: 34 U/L (ref 11.0–59.0)

## 2021-05-29 LAB — VITAMIN B12: Vitamin B-12: 638 pg/mL (ref 211–911)

## 2021-05-30 ENCOUNTER — Other Ambulatory Visit: Payer: Self-pay

## 2021-05-30 ENCOUNTER — Telehealth: Payer: Self-pay

## 2021-05-30 DIAGNOSIS — D649 Anemia, unspecified: Secondary | ICD-10-CM

## 2021-05-30 DIAGNOSIS — R1013 Epigastric pain: Secondary | ICD-10-CM

## 2021-05-30 MED ORDER — NA SULFATE-K SULFATE-MG SULF 17.5-3.13-1.6 GM/177ML PO SOLN: 1.0000 | Freq: Once | ORAL | 0 refills | Status: AC

## 2021-05-30 MED ORDER — FERROUS SULFATE 325 (65 FE) MG PO TBEC: 325.0000 mg | DELAYED_RELEASE_TABLET | Freq: Every day | ORAL | 2 refills | Status: AC

## 2021-05-30 MED ORDER — ONDANSETRON HCL 4 MG PO TABS: 4.0000 mg | ORAL_TABLET | Freq: Once | ORAL | 0 refills | Status: AC

## 2021-05-30 NOTE — Telephone Encounter (Signed)
-----   Message from Ladene Artist, MD sent at 05/30/2021 10:48 AM EST ----- Regarding: RE: Procedure Prefer SuPrep but Plenvu is ok if that is better covered by insurance. We can provide Zofran 4 mg po before each prep dose for her. ----- Message ----- From: Carl Best, RN Sent: 05/30/2021   9:54 AM EST To: Ladene Artist, MD Subject: Procedure                                      Hi Dr. Fuller Plan, patient has been rescheduled for an endo colon on 06/25/21 at 3:30 pm. Patient said that she gets nauseous easily, and was wondering if she could do tablets for the prep or potentially have medication for the nausea? I know you normally do suprep/plenvu, so I just wanted to check with you first. Thanks!

## 2021-05-30 NOTE — Telephone Encounter (Signed)
Patient is aware that suprep & zofran have been sent to pharmacy. She has been given instructions over the phone and letter mailed home.

## 2021-06-03 ENCOUNTER — Encounter: Payer: Medicare Other | Admitting: Gastroenterology

## 2021-06-09 ENCOUNTER — Telehealth: Payer: Self-pay

## 2021-06-09 NOTE — Telephone Encounter (Signed)
Patient called in with questions regarding colonoscopy & all questions answered.

## 2021-06-20 ENCOUNTER — Encounter: Payer: Self-pay | Admitting: Gastroenterology

## 2021-06-23 NOTE — Telephone Encounter (Signed)
Patient called in would like a call back to discuss appt  ?

## 2021-06-23 NOTE — Telephone Encounter (Signed)
Spoke with patient regarding procedure on Wednesday, and she mentioned about rescheduling. Patient advised to keep appointment due to schedule availability, and that she would be charged for canceling with short notice. Patient verbalized understanding, and all prep instructions answered.  ?

## 2021-06-24 ENCOUNTER — Telehealth: Payer: Self-pay

## 2021-06-24 NOTE — Telephone Encounter (Signed)
Patient called in to cancel endo colon tomorrow 3/15. She was in the hospital overnight due to food poisoning. She stated she would have someone bring in the hospital documents. Patient has been rescheduled for procedure on 07/29/21 at 3:00 pm.  ?

## 2021-06-25 ENCOUNTER — Encounter: Payer: Medicare Other | Admitting: Gastroenterology

## 2021-06-30 ENCOUNTER — Telehealth: Payer: Self-pay | Admitting: Psychiatry

## 2021-06-30 ENCOUNTER — Other Ambulatory Visit: Payer: Self-pay | Admitting: Psychiatry

## 2021-06-30 MED ORDER — METHYLPREDNISOLONE 4 MG PO TBPK: ORAL_TABLET | ORAL | 0 refills | Status: AC

## 2021-06-30 NOTE — Telephone Encounter (Signed)
Pt having migraine for several days. Nothing is working to relief the migraine. Can Dr. Billey Gosling call in medication to [phone] ? ? ?

## 2021-06-30 NOTE — Telephone Encounter (Signed)
Rx for a steroid pack sent to her pharmacy, thanks

## 2021-07-01 NOTE — Telephone Encounter (Signed)
Called patient, LVM advising Dr Billey Gosling sent in Rx yesterday, left # for questions.  ?

## 2021-07-02 ENCOUNTER — Encounter: Payer: Medicare Other | Admitting: Gastroenterology

## 2021-07-15 ENCOUNTER — Other Ambulatory Visit: Payer: Self-pay | Admitting: Psychiatry

## 2021-07-15 MED ORDER — TIZANIDINE HCL 2 MG PO TABS: 2.0000 mg | ORAL_TABLET | Freq: Every day | ORAL | 0 refills | Status: AC

## 2021-07-15 NOTE — Telephone Encounter (Signed)
I sent an rx for tizanidine to her pharmacy. She can take one pill at night for the next 5 days

## 2021-07-15 NOTE — Telephone Encounter (Signed)
Any other suggestions?

## 2021-07-15 NOTE — Telephone Encounter (Signed)
Contacted pt, informed her of Dr Georgina Peer recommendations and she was appreciative.  ?

## 2021-07-15 NOTE — Telephone Encounter (Signed)
Pt called stating that the medication that was called in for her eased the pain a little but she still has the headache. She would like to know if something else can be called in for her. Please advise. ?

## 2021-07-18 NOTE — Telephone Encounter (Signed)
Pt said medication, tiZANidine (ZANAFLEX) 2 MG tablet called in the did not get rid of the headache.Want to know if Dr. Billey Gosling can call in something else. Would like a call from the nurse. ?

## 2021-07-21 NOTE — Telephone Encounter (Signed)
I called pt and left a vm asking for a call back about making a f/u appt if a agreeable. Pt has tired steroid pack and tizanidine and neither have helped with symptoms fully.  ?If pt is agreeable TE staff can assist with making f/u with MD or NP. ?

## 2021-07-28 ENCOUNTER — Telehealth: Payer: Self-pay | Admitting: Gastroenterology

## 2021-07-28 NOTE — Telephone Encounter (Signed)
Inbound call from patient wanting to speak with Dr. Silvio Pate nurse. Patient stated that she has a procedure tomorrow and has something she would like to discuss. Please advise.  ?

## 2021-07-28 NOTE — Telephone Encounter (Signed)
The pt accidentally ate some solids at 10 am and wants to know if she can still have her endo colon procedure tomorrow.  She has been advised that she can proceed but to follow the instructions the rest of the day.  The pt has been advised of the information and verbalized understanding.    ?

## 2021-07-29 ENCOUNTER — Encounter: Payer: Self-pay | Admitting: Gastroenterology

## 2021-07-29 ENCOUNTER — Encounter: Payer: Medicare Other | Admitting: Gastroenterology

## 2021-07-29 ENCOUNTER — Ambulatory Visit (AMBULATORY_SURGERY_CENTER): Payer: Medicare Other | Admitting: Gastroenterology

## 2021-07-29 VITALS — BP 118/67 | HR 77 | Temp 98.0°F | Resp 12

## 2021-07-29 DIAGNOSIS — R1013 Epigastric pain: Secondary | ICD-10-CM | POA: Diagnosis not present

## 2021-07-29 DIAGNOSIS — K449 Diaphragmatic hernia without obstruction or gangrene: Secondary | ICD-10-CM | POA: Diagnosis not present

## 2021-07-29 DIAGNOSIS — K2970 Gastritis, unspecified, without bleeding: Secondary | ICD-10-CM

## 2021-07-29 DIAGNOSIS — D128 Benign neoplasm of rectum: Secondary | ICD-10-CM

## 2021-07-29 DIAGNOSIS — K621 Rectal polyp: Secondary | ICD-10-CM

## 2021-07-29 DIAGNOSIS — K640 First degree hemorrhoids: Secondary | ICD-10-CM

## 2021-07-29 DIAGNOSIS — D509 Iron deficiency anemia, unspecified: Secondary | ICD-10-CM

## 2021-07-29 DIAGNOSIS — D123 Benign neoplasm of transverse colon: Secondary | ICD-10-CM

## 2021-07-29 MED ORDER — SODIUM CHLORIDE 0.9 % IV SOLN: 500.0000 mL | Freq: Once | INTRAVENOUS | Status: DC

## 2021-07-29 NOTE — Progress Notes (Signed)
Pt's states no medical or surgical changes since previsit or office visit. 

## 2021-07-29 NOTE — Patient Instructions (Addendum)
Resume previous medications.  3 polyps removed and sent to pathology.  Other biopsies taken.  ? ?Await results for final recommendations.  Handouts on findings given to patient.  (Polyps and hemorrhoids) ? ?Continue Nexium twice a day.  Take 30 minutes before breakfast and 30 minutes before dinner.   ? ?Return to GI office in 4-6 weeks.  The office will call you to schedule.   ? ? ?YOU HAD AN ENDOSCOPIC PROCEDURE TODAY AT [facility]:   Refer to the procedure report that was given to you for any specific questions about what was found during the examination.  If the procedure report does not answer your questions, please call your gastroenterologist to clarify.  If you requested that your care partner not be given the details of your procedure findings, then the procedure report has been included in a sealed envelope for you to review at your convenience later. ? ?YOU SHOULD EXPECT: Some feelings of bloating in the abdomen. Passage of more gas than usual.  Walking can help get rid of the air that was put into your GI tract during the procedure and reduce the bloating. If you had a lower endoscopy (such as a colonoscopy or flexible sigmoidoscopy) you may notice spotting of blood in your stool or on the toilet paper. If you underwent a bowel prep for your procedure, you may not have a normal bowel movement for a few days. ? ?Please Note:  You might notice some irritation and congestion in your nose or some drainage.  This is from the oxygen used during your procedure.  There is no need for concern and it should clear up in a day or so. ? ?SYMPTOMS TO REPORT IMMEDIATELY: ? ?Following lower endoscopy (colonoscopy or flexible sigmoidoscopy): ? Excessive amounts of blood in the stool ? Significant tenderness or worsening of abdominal pains ? Swelling of the abdomen that is new, acute ? Fever of 100?F or higher ? ?Following upper endoscopy (EGD) ? Vomiting of blood or coffee ground material ? New chest  pain or pain under the shoulder blades ? Painful or persistently difficult swallowing ? New shortness of breath ? Fever of 100?F or higher ? Black, tarry-looking stools ? ?For urgent or emergent issues, a gastroenterologist can be reached at any hour by calling [phone]. ?Do not use MyChart messaging for urgent concerns.  ? ? ?DIET:  We do recommend a small meal at first, but then you may proceed to your regular diet.  Drink plenty of fluids but you should avoid alcoholic beverages for 24 hours. ? ?ACTIVITY:  You should plan to take it easy for the rest of today and you should NOT DRIVE or use heavy machinery until tomorrow (because of the sedation medicines used during the test).   ? ?FOLLOW UP: ?Our staff will call the number listed on your records 48-72 hours following your procedure to check on you and address any questions or concerns that you may have regarding the information given to you following your procedure. If we do not reach you, we will leave a message.  We will attempt to reach you two times.  During this call, we will ask if you have developed any symptoms of COVID 19. If you develop any symptoms (ie: fever, flu-like symptoms, shortness of breath, cough etc.) before then, please call [phone].  If you test positive for Covid 19 in the 2 weeks post procedure, please call and report this information to [place].   ? ?If  any biopsies were taken you will be contacted by phone or by letter within the next 1-3 weeks.  Please call us at [phone] if you have not heard about the biopsies in 3 weeks.  ? ? ?SIGNATURES/CONFIDENTIALITY: ?You and/or your care partner have signed paperwork which will be entered into your electronic medical record.  These signatures attest to the fact that that the information above on your After Visit Summary has been reviewed and is understood.  Full responsibility of the confidentiality of this discharge information lies with you and/or your care-partner.  ?

## 2021-07-29 NOTE — Op Note (Signed)
[place] ?Patient Name: Frances Robertson ?Procedure Date: 07/29/2021 2:54 PM ?MRN: [id number] ?Endoscopist: Ladene Artist , MD ?Age: 81 ?Referring MD:  ?Date of Birth: [birth year] ?Gender: Female ?Account #: [id number] ?Procedure:                Upper GI endoscopy ?Indications:              Epigastric abdominal pain, Unexplained iron  ?                          deficiency anemia ?Medicines:                Monitored Anesthesia Care ?Procedure:                Pre-Anesthesia Assessment: ?                          - Prior to the procedure, a History and Physical  ?                          was performed, and patient medications and  ?                          allergies were reviewed. The patient's tolerance of  ?                          previous anesthesia was also reviewed. The risks  ?                          and benefits of the procedure and the sedation  ?                          options and risks were discussed with the patient.  ?                          All questions were answered, and informed consent  ?                          was obtained. Prior Anticoagulants: The patient has  ?                          taken no previous anticoagulant or antiplatelet  ?                          agents. ASA Grade Assessment: II - A patient with  ?                          mild systemic disease. After reviewing the risks  ?                          and benefits, the patient was deemed in  ?                          satisfactory condition to undergo the procedure. ?  After obtaining informed consent, the endoscope was  ?                          passed under direct vision. Throughout the  ?                          procedure, the patient's blood pressure, pulse, and  ?                          oxygen saturations were monitored continuously. The  ?                          GIF [id number] #[id number] was introduced through the  ?                          mouth, and advanced to the second part of  duodenum.  ?                          The upper GI endoscopy was accomplished without  ?                          difficulty. The patient tolerated the procedure  ?                          well. ?Scope In: ?Scope Out: ?Findings:                 The examined esophagus was normal. ?                          A small hiatal hernia was present. ?                          Patchy mildly erythematous mucosa without bleeding  ?                          was found in the gastric body and in the gastric  ?                          antrum. Biopsies were taken with a cold forceps for  ?                          histology. ?                          The exam of the stomach was otherwise normal. ?                          The duodenal bulb and second portion of the  ?                          duodenum were normal. Biopsies for histology were  ?                          taken with a cold forceps for evaluation  of celiac  ?                          disease. ?Complications:            No immediate complications. ?Estimated Blood Loss:     Estimated blood loss was minimal. ?Impression:               - Normal esophagus. ?                          - Small hiatal hernia. ?                          - Erythematous mucosa in the gastric body and  ?                          antrum. Biopsied. ?                          - Normal duodenal bulb and second portion of the  ?                          duodenum. Biopsied. ?Recommendation:           - Patient has a contact number available for  ?                          emergencies. The signs and symptoms of potential  ?                          delayed complications were discussed with the  ?                          patient. Return to normal activities tomorrow.  ?                          Written discharge instructions were provided to the  ?                          patient. ?                          - Resume previous diet. ?                          - Follow antireflux measures. ?                           - Continue present medications including Nexium 40  ?                          mg po bid taken 30 minutes before breakfast and  ?                          evening meal (not 80 mg qd). ?                          -  Await pathology results. ?                          - GI office appt in 4-6 weeks. ?Ladene Artist, MD ?07/29/2021 3:40:26 PM ?This report has been signed electronically. ?

## 2021-07-29 NOTE — Progress Notes (Signed)
Called to room to assist during endoscopic procedure.  Patient ID and intended procedure confirmed with present staff. Received instructions for my participation in the procedure from the performing physician.  

## 2021-07-29 NOTE — Op Note (Signed)
[place] ?Patient Name: Frances Robertson ?Procedure Date: 07/29/2021 2:55 PM ?MRN: [id number] ?Endoscopist: Ladene Artist , MD ?Age: 81 ?Referring MD:  ?Date of Birth: 08/04/1940 ?Gender: Female ?Account #: [id number] ?Procedure:                Colonoscopy ?Indications:              Unexplained iron deficiency anemia ?Medicines:                Monitored Anesthesia Care ?Procedure:                Pre-Anesthesia Assessment: ?                          - Prior to the procedure, a History and Physical  ?                          was performed, and patient medications and  ?                          allergies were reviewed. The patient's tolerance of  ?                          previous anesthesia was also reviewed. The risks  ?                          and benefits of the procedure and the sedation  ?                          options and risks were discussed with the patient.  ?                          All questions were answered, and informed consent  ?                          was obtained. Prior Anticoagulants: The patient has  ?                          taken no previous anticoagulant or antiplatelet  ?                          agents. ASA Grade Assessment: II - A patient with  ?                          mild systemic disease. After reviewing the risks  ?                          and benefits, the patient was deemed in  ?                          satisfactory condition to undergo the procedure. ?                          After obtaining informed consent, the colonoscope  ?  was passed under direct vision. Throughout the  ?                          procedure, the patient's blood pressure, pulse, and  ?                          oxygen saturations were monitored continuously. The  ?                          Olympus Scope [id number] was introduced through the  ?                          anus and advanced to the the cecum, identified by  ?                          appendiceal orifice and  ileocecal valve. The  ?                          ileocecal valve, appendiceal orifice, and rectum  ?                          were photographed. The quality of the bowel  ?                          preparation was good. The colonoscopy was somewhat  ?                          difficult due to a redundant colon, significant  ?                          looping and a tortuous colon. The patient tolerated  ?                          the procedure well. ?Scope In: 3:01:46 PM ?Scope Out: 3:24:26 PM ?Scope Withdrawal Time: 0 hours 15 minutes 58 seconds  ?Total Procedure Duration: 0 hours 22 minutes 40 seconds  ?Findings:                 The perianal and digital rectal examinations were  ?                          normal. ?                          Two sessile polyps were found in the transverse  ?                          colon. The polyps were 6 to 7 mm in size. These  ?                          polyps were removed with a cold snare. Resection  ?                          and retrieval were complete. ?  A 5 mm polyp was found in the rectum. The polyp was  ?                          sessile. The polyp was removed with a cold snare.  ?                          Resection and retrieval were complete. ?                          Internal hemorrhoids were found during  ?                          retroflexion. The hemorrhoids were small and Grade  ?                          I (internal hemorrhoids that do not prolapse). ?                          The exam was otherwise without abnormality on  ?                          direct and retroflexion views. ?Complications:            No immediate complications. Estimated blood loss:  ?                          None. ?Estimated Blood Loss:     Estimated blood loss: none. ?Impression:               - Two 6 to 7 mm polyps in the transverse colon,  ?                          removed with a cold snare. Resected and retrieved. ?                          - One 5 mm polyp  in the rectum, removed with a cold  ?                          snare. Resected and retrieved. ?                          - Small internal hemorrhoids. ?                          - The examination was otherwise normal on direct  ?                          and retroflexion views. ?Recommendation:           - Patient has a contact number available for  ?                          emergencies. The signs and symptoms of potential  ?  delayed complications were discussed with the  ?                          patient. Return to normal activities tomorrow.  ?                          Written discharge instructions were provided to the  ?                          patient. ?                          - Resume previous diet. ?                          - Continue present medications. ?                          - Await pathology results. ?                          - No repeat colonoscopy due to age. ?Ladene Artist, MD ?07/29/2021 3:36:22 PM ?This report has been signed electronically. ?

## 2021-07-29 NOTE — Progress Notes (Signed)
Report to PACU, RN, vss, BBS= Clear.  

## 2021-07-29 NOTE — Progress Notes (Signed)
? ?History & Physical ? ?Primary Care Physician:  Imagene Riches, NP ?Primary Gastroenterologist: Lucio Edward, MD ? ?CHIEF COMPLAINT: Epigastric pain, iron deficiency ? ?HPI: Frances Robertson is a 81 y.o. female with epigastric pain and iron deficiency anemia here for colonoscopy and EGD.  ? ? ?Past Medical History:  ?Diagnosis Date  ? Allergy   ? Anemia, unspecified   ? Arthritis   ? Cataract   ? GERD (gastroesophageal reflux disease)   ? takes Omeprazole daily  ? Headache(784.0)   ? takes Topamax daily and Atenolol daily;takes Tramadol daily as well  ? Insomnia   ? takes Ambien nightly  ? Joint pain   ? Joint swelling   ? Low back pain   ? herniated disc  ? PAC (premature atrial contraction)   ? Pneumonia hx of-as a child  ? PONV (postoperative nausea and vomiting)   ? Tinnitus   ? Unspecified hypothyroidism   ? takes Synthroid daily  ? ? ?Past Surgical History:  ?Procedure Laterality Date  ? CARPAL TUNNEL RELEASE Bilateral   ? CATARACT EXTRACTION Bilateral   ? CHOLECYSTECTOMY    ? ESOPHAGOGASTRODUODENOSCOPY    ? LUMBAR LAMINECTOMY/DECOMPRESSION MICRODISCECTOMY Right 07/23/2014  ? Procedure: Right Lumbar four-five microdiskectomy;  Surgeon: Newman Pies, MD;  Location: [facility];  Service: Neurosurgery;  Laterality: Right;  Right Lumbar four-five microdiskectomy  ? TUBAL LIGATION Bilateral   ? ? ?Prior to Admission medications   ?Medication Sig Start Date End Date Taking? Authorizing Provider  ?atenolol (TENORMIN) 50 MG tablet Take 50 mg by mouth daily.    [provider]  ?esomeprazole (NEXIUM) 40 MG capsule Take 1 capsule (40 mg total) by mouth 2 (two) times daily. ?Patient taking differently: Take 80 mg by mouth every morning. 01/10/21   Ladene Artist, MD  ?ferrous sulfate 325 (65 FE) MG EC tablet Take 1 tablet (325 mg total) by mouth daily with breakfast. 05/30/21 06/29/21  Ladene Artist, MD  ?gabapentin (NEURONTIN) 300 MG capsule Take 2 capsules (600 mg total) by mouth 3 (three) times daily.  03/25/21   Genia Harold, MD  ?levothyroxine (SYNTHROID) 88 MCG tablet Take 88 mcg by mouth daily before breakfast.    [provider]  ?methylPREDNISolone (MEDROL DOSEPAK) 4 MG TBPK tablet Take as directed by packaging 06/30/21   Genia Harold, MD  ?polyethylene glycol (MIRALAX / GLYCOLAX) 17 g packet Take 17 g by mouth daily.    [provider]  ?topiramate (TOPAMAX) 50 MG tablet Take 3 tablets (150 mg total) by mouth at bedtime. 04/17/20   Suzzanne Cloud, NP  ?traMADol Veatrice Bourbon) 50 MG tablet Tramadol taper: Take 1 pill every 8 hours for one week. Then take one pill in AM, 0.5 pill in afternoon, and 1 pill in PM for one week. Then take one pill twice a day for one week. Then take 0.5 pill in AM and 1 pill in PM for one week. Then take 1 pill daily for one week. Then take one pill every other day for one week. Then take 0.5 pill every other day for one week. Then stop. 02/28/21   Genia Harold, MD  ?zolpidem (AMBIEN) 10 MG tablet Take 10 mg by mouth at bedtime.     [provider]  ? ? ?Current Outpatient Medications  ?Medication Sig Dispense Refill  ? atenolol (TENORMIN) 50 MG tablet Take 50 mg by mouth daily.    ? esomeprazole (NEXIUM) 40 MG capsule Take 1 capsule (40 mg total)  by mouth 2 (two) times daily. (Patient taking differently: Take 80 mg by mouth every morning.) 60 capsule 11  ? ferrous sulfate 325 (65 FE) MG EC tablet Take 1 tablet (325 mg total) by mouth daily with breakfast. 30 tablet 2  ? gabapentin (NEURONTIN) 300 MG capsule Take 2 capsules (600 mg total) by mouth 3 (three) times daily. 540 capsule 1  ? levothyroxine (SYNTHROID) 88 MCG tablet Take 88 mcg by mouth daily before breakfast.    ? methylPREDNISolone (MEDROL DOSEPAK) 4 MG TBPK tablet Take as directed by packaging 1 each 0  ? polyethylene glycol (MIRALAX / GLYCOLAX) 17 g packet Take 17 g by mouth daily.    ? topiramate (TOPAMAX) 50 MG tablet Take 3 tablets (150 mg total) by mouth at bedtime. 270 tablet 3  ?  traMADol (ULTRAM) 50 MG tablet Tramadol taper: Take 1 pill every 8 hours for one week. Then take one pill in AM, 0.5 pill in afternoon, and 1 pill in PM for one week. Then take one pill twice a day for one week. Then take 0.5 pill in AM and 1 pill in PM for one week. Then take 1 pill daily for one week. Then take one pill every other day for one week. Then take 0.5 pill every other day for one week. Then stop. 76 tablet 0  ? zolpidem (AMBIEN) 10 MG tablet Take 10 mg by mouth at bedtime.     ? ?No current facility-administered medications for this visit.  ? ? ?Allergies as of 07/29/2021 - Review Complete 07/29/2021  ?Allergen Reaction Noted  ? Benzalkonium chloride  05/15/2015  ? Hydrocodone-acetaminophen  05/15/2015  ? Piroxicam  05/15/2015  ? Celandine [chelidonium majus] Nausea And Vomiting 03/18/2012  ? Celebrex [celecoxib] Nausea And Vomiting 03/18/2012  ? Cephalexin Rash 01/21/2021  ? Famotidine Rash 01/21/2021  ? Hydrocodone Other (See Comments) 07/12/2014  ? Neosporin [neomycin-bacitracin zn-polymyx] Rash 03/18/2012  ? Other Rash and Other (See Comments) 07/09/2014  ? Oxycodone Nausea And Vomiting 07/12/2014  ? Sucralfate Rash 01/21/2021  ? ? ?Family History  ?Problem Relation Age of Onset  ? Venous thrombosis Mother 32  ?     vague history of this vs aneurysm somewhere  ? Heart attack Mother   ? Stroke Father 46  ?     Sisters x 2 with breast cancer, brothers x 2 died with lung cancer  ? Breast cancer Sister   ? Cancer - Lung Brother   ?     smoker  ? Cancer - Lung Brother   ?     smoker   ? Sudden death Son 7  ?     stroke  ? Colon cancer Neg Hx   ? Esophageal cancer Neg Hx   ? Stomach cancer Neg Hx   ? Rectal cancer Neg Hx   ? Pancreatic cancer Neg Hx   ? ? ?Social History  ? ?Socioeconomic History  ? Marital status: Widowed  ?  Spouse name: Not on file  ? Number of children: 2  ? Years of education: 37  ? Highest education level: Not on file  ?Occupational History  ? Occupation: [employer]  ?  Employer:  [employer]  ?  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    ?Tobacco Use  ? Smoking status: Never  ? Smokeless tobacco: Never  ?Vaping Use  ? Vaping Use: Never used  ?Substance and Sexual Activity  ? Alcohol use: No  ? Drug use:  No  ? Sexual activity: Not on file  ?Other Topics Concern  ? Not on file  ?Social History Narrative  ? Lives alone.   ? Patient is right handed.  ? Patient does not drink caffeine.  ? ?Social Determinants of Health  ? ?Financial Resource Strain: Not on file  ?Food Insecurity: Not on file  ?Transportation Needs: Not on file  ?Physical Activity: Not on file  ?Stress: Not on file  ?Social Connections: Not on file  ?Intimate Partner Violence: Not on file  ? ? ?Review of Systems: ? ?All systems reviewed an negative except where noted in HPI. ? ?Gen: Denies any fever, chills, sweats, anorexia, fatigue, weakness, malaise, weight loss, and sleep disorder ?CV: Denies chest pain, angina, palpitations, syncope, orthopnea, PND, peripheral edema, and claudication. ?Resp: Denies dyspnea at rest, dyspnea with exercise, cough, sputum, wheezing, coughing up blood, and pleurisy. ?GI: Denies vomiting blood, jaundice, and fecal incontinence.   Denies dysphagia or odynophagia. ?GU : Denies urinary burning, blood in urine, urinary frequency, urinary hesitancy, nocturnal urination, and urinary incontinence. ?MS: Denies joint pain, limitation of movement, and swelling, stiffness, low back pain, extremity pain. Denies muscle weakness, cramps, atrophy.  ?Derm: Denies rash, itching, dry skin, hives, moles, warts, or unhealing ulcers.  ?Psych: Denies depression, anxiety, memory loss, suicidal ideation, hallucinations, paranoia, and confusion. ?Heme: Denies bruising, bleeding, and enlarged lymph nodes. ?Neuro:  Denies any headaches, dizziness, paresthesias. ?Endo:  Denies any problems with DM, thyroid, adrenal function. ? ? ?Physical Exam: ?General:  Alert, well-developed, in NAD ?Head:   Normocephalic and atraumatic. ?Eyes:  Sclera clear, no icterus.   Conjunctiva pink. ?Ears:  Normal auditory acuity. ?Mouth:  No deformity or lesions.  ?Neck:  Supple; no masses . ?Lungs:  Clear throughout to ausc

## 2021-07-31 ENCOUNTER — Telehealth: Payer: Self-pay | Admitting: *Deleted

## 2021-07-31 NOTE — Telephone Encounter (Signed)
?  Follow up Call- ? ? ?  07/29/2021  ?  2:47 PM 01/02/2019  ?  9:23 AM 11/22/2018  ?  1:22 PM  ?Call back number  ?Post procedure Call Back phone  # [phone]  ?Permission to leave phone message Yes Yes Yes  ?  ? ?Patient questions: ? ?Do you have a fever, pain , or abdominal swelling? No. ?Pain Score  0 * ? ?Have you tolerated food without any problems? Yes.   ? ?Have you been able to return to your normal activities? Yes.   ? ?Do you have any questions about your discharge instructions: ?Diet   No. ?Medications  No. ?Follow up visit  No. ? ?Do you have questions or concerns about your Care? No. ? ?Actions: ?* If pain score is 4 or above: ?No action needed, pain <4. ? ? ?

## 2021-08-04 ENCOUNTER — Telehealth: Payer: Self-pay

## 2021-08-04 NOTE — Telephone Encounter (Signed)
Left message for patient to call back. Follow up scheduled with Dr. Fuller Plan on 5/25 at 9:50 am.  ?

## 2021-08-05 NOTE — Telephone Encounter (Signed)
Patient has been rescheduled for 09/04/21 at 11:10 am. Patient is aware of appointment.  ?

## 2021-08-05 NOTE — Telephone Encounter (Signed)
Left message for patient to call back  

## 2021-08-18 ENCOUNTER — Encounter: Payer: Self-pay | Admitting: Gastroenterology

## 2021-08-25 ENCOUNTER — Other Ambulatory Visit: Payer: Self-pay | Admitting: Neurology

## 2021-09-04 ENCOUNTER — Encounter: Payer: Self-pay | Admitting: Gastroenterology

## 2021-09-04 ENCOUNTER — Ambulatory Visit: Payer: Medicare Other | Admitting: Gastroenterology

## 2021-09-04 VITALS — BP 150/78 | HR 88 | Ht 60.0 in | Wt 124.2 lb

## 2021-09-04 DIAGNOSIS — D509 Iron deficiency anemia, unspecified: Secondary | ICD-10-CM

## 2021-09-04 DIAGNOSIS — K219 Gastro-esophageal reflux disease without esophagitis: Secondary | ICD-10-CM | POA: Diagnosis not present

## 2021-09-04 NOTE — Patient Instructions (Signed)
Continue anti-reflux measures:  Patient advised to avoid spicy, acidic, citrus, chocolate, mints, fruit and fruit juices.  Limit the intake of caffeine, alcohol and Soda.  Don't exercise too soon after eating.  Don't lie down within 3-4 hours of eating.  Elevate the head of your bed.  Stay on current medications.   The [facility] providers would like to encourage you to use [facility] to communicate with providers for non-urgent requests or questions.  Due to long hold times on the telephone, sending your provider a message by [facility] may be a faster and more efficient way to get a response.  Please allow 48 business hours for a response.  Please remember that this is for non-urgent requests.    Thank you for choosing me and [facility].  Pricilla Riffle. Dagoberto Ligas., MD., Marval Regal

## 2021-09-04 NOTE — Progress Notes (Signed)
    Assessment     Iron deficiency anemia without a GI source identified GERD, chronic gastritis, epigastric pain improved on Nexium twice daily Personal history of adenomatous colon polyps   Recommendations    Follow-up with PCP for ongoing management of IDA.  If IDA is a persistent or recurrent problem consider capsule endoscopy for further evaluation Continue Nexium 40 mg p.o. twice daily and follow antireflux measures No plans for future surveillance colonoscopies due to age. REV in 1 year.   HPI    This is an 81 year old female returning for GERD, epigastric pain, IDA.  Colonoscopy and EGD as outlined.  No clear source for iron deficiency anemia noted.  Her epigastric pain and reflux symptoms are under much better control on Nexium twice daily.  Colonoscopy 07/2021 - Two 6 to 7 mm polyps in the transverse colon, removed with a cold snare. Resected and retrieved. - One 5 mm polyp in the rectum, removed with a cold snare. Resected and retrieved. - Small internal hemorrhoids. - The examination was otherwise normal on direct and retroflexion views. Path: 2 tubular adenomas  EGD 07/2021 - Normal esophagus. - Small hiatal hernia. - Erythematous mucosa in the gastric body and antrum. Biopsied. - Normal duodenal bulb and second portion of the duodenum. Biopsied. Path: Normal duodenum and chronic gastritis H. pylori negative   Labs / Imaging       Latest Ref Rng & Units 05/28/2021   11:42 AM 11/13/2019   11:46 AM  Hepatic Function  Total Protein 6.0 - 8.3 g/dL 7.1   7.3    Albumin 3.5 - 5.2 g/dL 4.3   4.5    AST 0 - 37 U/L 17   18    ALT 0 - 35 U/L 8   9    Alk Phosphatase 39 - 117 U/L 82   86    Total Bilirubin 0.2 - 1.2 mg/dL 0.3   0.3         Latest Ref Rng & Units 05/28/2021   11:42 AM 11/13/2019   11:46 AM 07/12/2014   11:26 AM  CBC  WBC 4.0 - 10.5 K/uL 4.9   6.2   4.8    Hemoglobin 12.0 - 15.0 g/dL 10.6   11.4   11.0    Hematocrit 36.0 - 46.0 % 33.1   34.7   34.4     Platelets 150.0 - 400.0 K/uL 192.0   193.0   168      Current Medications, Allergies, Past Medical History, Past Surgical History, Family History and Social History were reviewed in [employer] record.   Physical Exam: General: Well developed, well nourished, no acute distress Head: Normocephalic and atraumatic Eyes: Sclerae anicteric, EOMI Ears: Normal auditory acuity Mouth: No lesions Lungs: Clear throughout to auscultation Heart: Regular rate and rhythm; no murmurs, rubs or bruits Abdomen: Soft, non tender and non distended. No masses, hepatosplenomegaly or hernias noted. Normal Bowel sounds Rectal: Not done Musculoskeletal: Symmetrical with no gross deformities  Pulses:  Normal pulses noted Extremities: No clubbing, cyanosis, edema or deformities noted Neurological: Alert oriented x 4, grossly nonfocal Psychological:  Alert and cooperative. Normal mood and affect   Frances Robertson T. Fuller Plan, MD 09/04/2021, 11:29 AM

## 2022-05-18 ENCOUNTER — Encounter: Payer: Self-pay | Admitting: Cardiology

## 2022-05-18 DIAGNOSIS — I10 Essential (primary) hypertension: Secondary | ICD-10-CM | POA: Insufficient documentation

## 2022-05-18 NOTE — Progress Notes (Unsigned)
Cardiology Office Note   Date:  05/20/2022   ID:  Xochil, Shanker 1941-03-04, MRN [id number]  PCP:  Rhea Bleacher, NP  Cardiologist:   Minus Breeding, MD Referring:   Rhea Bleacher, NP  Chief Complaint  Patient presents with   Hypertension      History of Present Illness: Frances Robertson is a 82 y.o. female who presents for evaluation of difficult to control HTN.  She is noted blood pressures of 215/108.  She reports going to [facility] because of this although I do not have these records.  She says it she gets so nervous about as she just stopped taking her blood pressures.  Today her blood pressure is well-controlled and she is on a regimen as listed.  She has not really been recording her blood pressures as far as I can see since then.  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   The patient denies any new symptoms such as chest discomfort, neck or arm discomfort. There has been no new shortness of breath, PND or orthopnea. There have been no reported palpitations, presyncope or syncope.   I saw her in 2017 for evaluation of chest pain.  Perfusion study was negative.      Past Medical History:  Diagnosis Date   Anemia, unspecified    Arthritis    Cataract    GERD (gastroesophageal reflux disease)    takes Omeprazole daily   Insomnia    takes Ambien nightly   Low back pain    herniated disc   PAC (premature atrial contraction)    Pneumonia hx of-as a child   PONV (postoperative nausea and vomiting)    Unspecified hypothyroidism    takes Synthroid daily    Past Surgical History:  Procedure Laterality Date   CARPAL TUNNEL RELEASE Bilateral    CATARACT EXTRACTION Bilateral    CHOLECYSTECTOMY     ESOPHAGOGASTRODUODENOSCOPY     LUMBAR LAMINECTOMY/DECOMPRESSION MICRODISCECTOMY Right 07/23/2014   Procedure: Right Lumbar four-five microdiskectomy;  Surgeon: Newman Pies, MD;  Location: [facility];  Service: Neurosurgery;   Laterality: Right;  Right Lumbar four-five microdiskectomy   TUBAL LIGATION Bilateral      Current Outpatient Medications  Medication Sig Dispense Refill   atenolol (TENORMIN) 100 MG tablet Take 100 mg by mouth daily.     esomeprazole (NEXIUM) 40 MG capsule Take 1 capsule (40 mg total) by mouth 2 (two) times daily. (Patient taking differently: Take 80 mg by mouth every morning.) 60 capsule 11   gabapentin (NEURONTIN) 300 MG capsule Take 2 capsules (600 mg total) by mouth 3 (three) times daily. 540 capsule 1   hydrALAZINE (APRESOLINE) 25 MG tablet Take 25 mg by mouth 2 (two) times daily.     levothyroxine (SYNTHROID) 100 MCG tablet Take 100 mcg by mouth every morning.     levothyroxine (SYNTHROID) 88 MCG tablet Take 88 mcg by mouth daily before breakfast.     losartan (COZAAR) 100 MG tablet Take 100 mg by mouth daily.     NURTEC 75 MG TBDP Place 1 tablet under the tongue daily as needed.     topiramate (TOPAMAX) 50 MG tablet TAKE 3 TABLETS BY MOUTH AT BEDTIME 270 tablet 1   traMADol (ULTRAM) 50 MG tablet Tramadol taper: Take 1 pill every 8 hours for one week. Then take one pill in AM, 0.5 pill in afternoon, and 1 pill in PM for one week. Then  take one pill twice a day for one week. Then take 0.5 pill in AM and 1 pill in PM for one week. Then take 1 pill daily for one week. Then take one pill every other day for one week. Then take 0.5 pill every other day for one week. Then stop. 76 tablet 0   zolpidem (AMBIEN) 10 MG tablet Take 10 mg by mouth at bedtime.      aspirin EC 81 MG tablet Take 1 tablet (81 mg total) by mouth every other day. Swallow whole. 30 tablet 11   ferrous sulfate 325 (65 FE) MG EC tablet Take 1 tablet (325 mg total) by mouth daily with breakfast. (Patient not taking: Reported on 05/20/2022) 30 tablet 2   methylPREDNISolone (MEDROL DOSEPAK) 4 MG TBPK tablet Take as directed by packaging (Patient not taking: Reported on 05/20/2022) 1 each 0   rizatriptan (MAXALT) 5 MG tablet Take 1  tablet by mouth as needed. (Patient not taking: Reported on 05/20/2022)     No current facility-administered medications for this visit.    Allergies:   Benzalkonium chloride, Hydrocodone-acetaminophen, Piroxicam, Celandine [chelidonium majus], Celebrex [celecoxib], Cephalexin, Famotidine, Hydrocodone, Neosporin [neomycin-bacitracin zn-polymyx], Other, Oxycodone, and Sucralfate    Social History:  The patient  reports that she has never smoked. She has never used smokeless tobacco. She reports that she does not drink alcohol and does not use drugs.   Family History:  The patient's family history includes Breast cancer in her sister; Cancer - Lung in her brother and brother; Heart attack in her mother; Stroke (age of onset: 77) in her father; Sudden death (age of onset: 50) in her son; Venous thrombosis (age of onset: 57) in her mother.    ROS:  Please see the history of present illness.   Otherwise, review of systems are positive for bruising.   All other systems are reviewed and negative.    PHYSICAL EXAM: VS:  BP 130/88 (BP Location: Left Arm, Patient Position: Sitting, Cuff Size: Normal)   Pulse 84   Ht '5\' 1"'$  (1.549 m)   Wt 137 lb 9.6 oz (62.4 kg)   SpO2 97%   BMI 26.00 kg/m  , BMI Body mass index is 26 kg/m. GENERAL:  Well appearing HEENT:  Pupils equal round and reactive, fundi not visualized, oral mucosa unremarkable NECK:  No jugular venous distention, waveform within normal limits, carotid upstroke brisk and symmetric, no bruits, no thyromegaly LYMPHATICS:  No cervical, inguinal adenopathy LUNGS:  Clear to auscultation bilaterally BACK:  No CVA tenderness CHEST:  Unremarkable HEART:  PMI not displaced or sustained,S1 and S2 within normal limits, no S3, no S4, no clicks, no rubs, no murmurs ABD:  Flat, positive bowel sounds normal in frequency in pitch, no bruits, no rebound, no guarding, no midline pulsatile mass, no hepatomegaly, no splenomegaly EXT:  2 plus pulses  throughout, no edema, no cyanosis no clubbing SKIN:  No rashes no nodules NEURO:  Cranial nerves II through XII grossly intact, motor grossly intact throughout PSYCH:  Cognitively intact, oriented to person place and time    EKG:  EKG is ordered today. The ekg ordered today demonstrates sinus rhythm with premature atrial contractions, axis within normal limits, intervals within normal limits, poor anterior R wave progression.   Recent Labs: 05/28/2021: ALT 8; BUN 13; Creatinine, Ser 0.99; Hemoglobin 10.6; Platelets 192.0; Potassium 4.6; Sodium 137    Lipid Panel No results found for: "CHOL", "TRIG", "HDL", "CHOLHDL", "VLDL", "LDLCALC", "LDLDIRECT"    Wt  Readings from Last 3 Encounters:  05/20/22 137 lb 9.6 oz (62.4 kg)  09/04/21 124 lb 4 oz (56.4 kg)  05/28/21 126 lb 2 oz (57.2 kg)      Other studies Reviewed: Additional studies/ records that were reviewed today include: Office records. . Review of the above records demonstrates:  Please see elsewhere in the note.     ASSESSMENT AND PLAN:  HTN: Currently she is on a regimen that might be working.  She is not checking her blood pressure routinely.  I do see that her systolics were in the 295F at the primary care office appointment.  However, for now we will continue the meds as listed and have her keep a blood pressure 3 times a day and I will review these and make med changes.  We could titrate the hydralazine as needed.  I did tell her she could take an extra 25 mg of hydralazine anytime her systolic blood pressures greater than 180.  Also check with her primary care doctor to see if there have been recent labs to include electrolytes and TSH.   Current medicines are reviewed at length with the patient today.  The patient does not have concerns regarding medicines.  The following changes have been made:  no change  Labs/ tests ordered today include:   Orders Placed This Encounter  Procedures   TSH   EKG 12-Lead      Disposition:   FU with me in 4 months.     Signed, Minus Breeding, MD  05/20/2022 2:03 PM    [place]

## 2022-05-20 ENCOUNTER — Ambulatory Visit: Payer: Medicare Other | Attending: Cardiology | Admitting: Cardiology

## 2022-05-20 ENCOUNTER — Ambulatory Visit: Payer: Medicare Other | Admitting: Cardiology

## 2022-05-20 ENCOUNTER — Encounter: Payer: Self-pay | Admitting: Cardiology

## 2022-05-20 VITALS — BP 130/88 | HR 84 | Ht 61.0 in | Wt 137.6 lb

## 2022-05-20 DIAGNOSIS — I10 Essential (primary) hypertension: Secondary | ICD-10-CM | POA: Diagnosis not present

## 2022-05-20 DIAGNOSIS — I491 Atrial premature depolarization: Secondary | ICD-10-CM

## 2022-05-20 MED ORDER — ASPIRIN 81 MG PO TBEC: 81.0000 mg | DELAYED_RELEASE_TABLET | ORAL | 11 refills | Status: AC

## 2022-05-20 NOTE — Patient Instructions (Addendum)
Medication Instructions:    May take an extra Hydralazine 25 mg if blood pressure is greater than 924 systolic   *If you need a refill on your cardiac medications before your next appointment, please call your pharmacy*   Lab Work:  TSH   If you have labs (blood work) drawn today and your tests are completely normal, you will receive your results only by: [place] (if you have MyChart) OR A paper copy in the mail If you have any lab test that is abnormal or we need to change your treatment, we will call you to review the results.   Testing/Procedures:  Not needed  Follow-Up: At [facility], you and your health needs are our priority.  As part of our continuing mission to provide you with exceptional heart care, we have created designated Provider Care Teams.  These Care Teams include your primary Cardiologist (physician) and Advanced Practice Providers (APPs -  Physician Assistants and Nurse Practitioners) who all work together to provide you with the care you need, when you need it.  We recommend signing up for the patient portal called "MyChart".  Sign up information is provided on this After Visit Summary.  MyChart is used to connect with patients for Virtual Visits (Telemedicine).  Patients are able to view lab/test results, encounter notes, upcoming appointments, etc.  Non-urgent messages can be sent to your provider as well.   To learn more about what you can do with MyChart, go to [email or web address].    Your next appointment:   4 month(s)  The format for your next appointment:   In Person  Provider:   Minus Breeding, MD    Other Instructions  Please keep a reading of your blood pressure  - 3 times a day  for next 2 weeks  and send information to Dr Percival Spanish through [place]

## 2022-05-21 LAB — TSH: TSH: 37.4 u[IU]/mL — ABNORMAL HIGH (ref 0.450–4.500)

## 2022-05-26 NOTE — Progress Notes (Deleted)
   CC:  headaches  Follow-up Visit  Last visit: 05/27/21  Brief HPI: 82 year old female with a history of GERD, hypothyroidism who follows in clinic for migraines.   At her last visit she was continued on Topamax and gabapentin for headache prevention.  Interval History: ***   Headache days per month: *** Migraine days per month*** Headache free days per month: ***  Current Headache Regimen: Preventative: Topamax 150 mg QHS, gabapentin 600 mg TID  Abortive: ***   Prior Therapies                                  Topamax 150 mg QHS Gabapentin 600 mg TID Atenolol 50 mg daily Effexor 37.5 mg daily - blurred vision Tramadol tizanidine Nurtec  Physical Exam:   Vital Signs: There were no vitals taken for this visit. GENERAL:  well appearing, in no acute distress, alert  SKIN:  Color, texture, turgor normal. No rashes or lesions HEAD:  Normocephalic/atraumatic. RESP: normal respiratory effort MSK:  No gross joint deformities.   NEUROLOGICAL: Mental Status: Alert, oriented to person, place and time, Follows commands, and Speech fluent and appropriate. Cranial Nerves: PERRL, face symmetric, no dysarthria, hearing grossly intact Motor: moves all extremities equally Gait: normal-based.  IMPRESSION: ***  PLAN: ***   Follow-up: ***  I spent a total of *** minutes on the date of the service. Headache education was done. Discussed lifestyle modification including increased oral hydration, decreased caffeine, exercise and stress management. Discussed treatment options including preventive and acute medications, natural supplements, and infusion therapy. Discussed medication overuse headache and to limit use of acute treatments to no more than 2 days/week or 10 days/month. Discussed medication side effects, adverse reactions and drug interactions. Written educational materials and patient instructions outlining all of the above were given.  Genia Harold, MD

## 2022-05-27 ENCOUNTER — Encounter: Payer: Self-pay | Admitting: Psychiatry

## 2022-05-27 ENCOUNTER — Ambulatory Visit: Payer: Self-pay | Admitting: Psychiatry

## 2022-06-01 ENCOUNTER — Telehealth: Payer: Self-pay | Admitting: Cardiology

## 2022-06-01 NOTE — Telephone Encounter (Signed)
Spoke with pt regarding her thyroid function tests. Explained to pt that her labs show that her TSH is elevated. Lab work was sent over to Marcie Mowers, NP for dosage change of synthroid. Pt verbalizes understanding.

## 2022-06-01 NOTE — Telephone Encounter (Signed)
Pt c/o medication issue:  1. Name of Medication: levothyroxine (SYNTHROID) 100 MCG tablet   2. How are you currently taking this medication (dosage and times per day)?  MCG  3. Are you having a reaction (difficulty breathing--STAT)?   4. What is your medication issue?  Patient is calling to get clarification on this medication. She states she was given different doses for this and would like to discuss this. Please advise.

## 2022-06-09 ENCOUNTER — Encounter: Payer: Self-pay | Admitting: Cardiology

## 2022-06-09 MED ORDER — HYDRALAZINE HCL 25 MG PO TABS: 25.0000 mg | ORAL_TABLET | Freq: Three times a day (TID) | ORAL | 3 refills | Status: DC

## 2022-07-22 IMAGING — CT CT CTA ABD/PEL W/CM AND/OR W/O CM
2 of 11 series · 12 of 46 positions shown, 17 images · IV contrast (omnipaque)
Comparison: 05/02/2018

CLINICAL DATA: 79-year-old female with a history of mesenteric
ischemia

EXAM:
CTA ABDOMEN AND PELVIS WITHOUT AND WITH CONTRAST
TECHNIQUE: Multidetector CT imaging of the abdomen and pelvis was performed
using the standard protocol during bolus administration of
intravenous contrast. Multiplanar reconstructed images and MIPs were
obtained and reviewed to evaluate the vascular anatomy.
CONTRAST:  100mL OMNIPAQUE IOHEXOL 350 MG/ML SOLN

[Series 5: axial arterial · axial · arterial · 0.72mm/px · z∈[+754,+1120]mm · 11 of 209 slices shown, 15 images]
[im 17/209  soft-tissue]
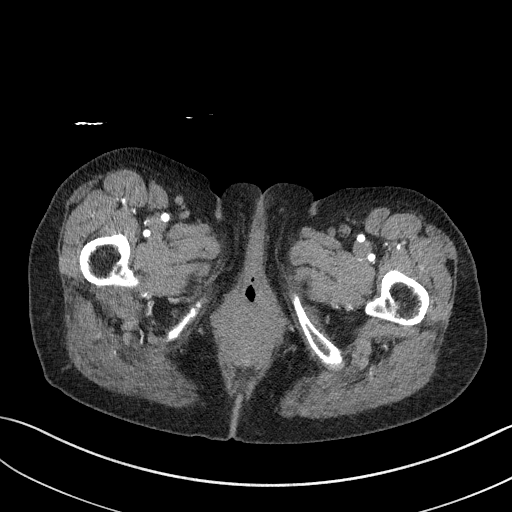
[im 17/209  bone]
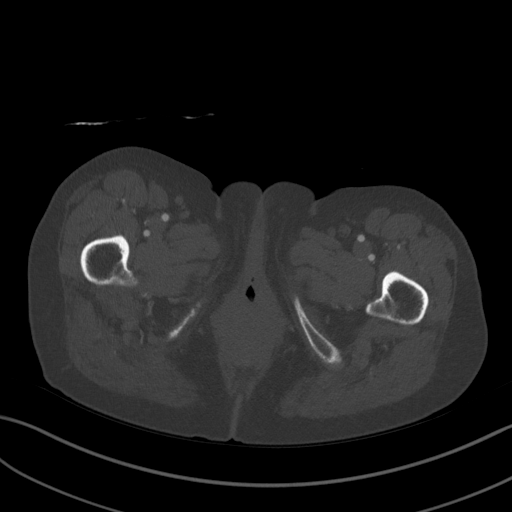
[im 42/209  soft-tissue]
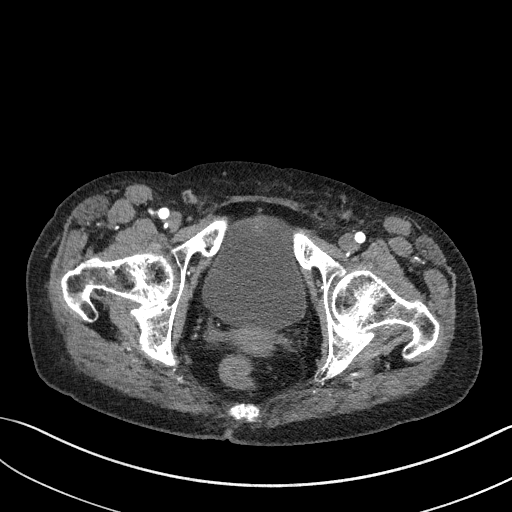
[im 59/209  soft-tissue]
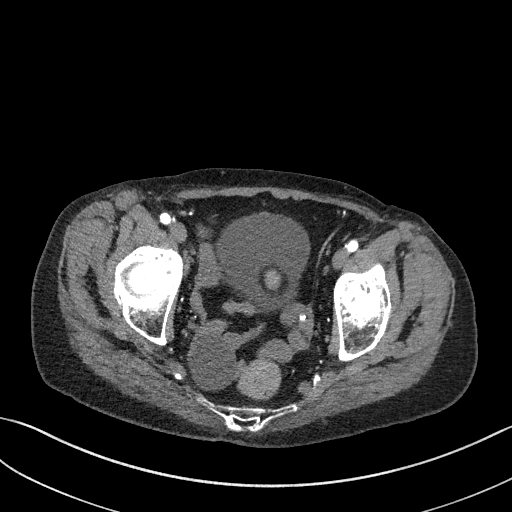
[im 84/209  soft-tissue]
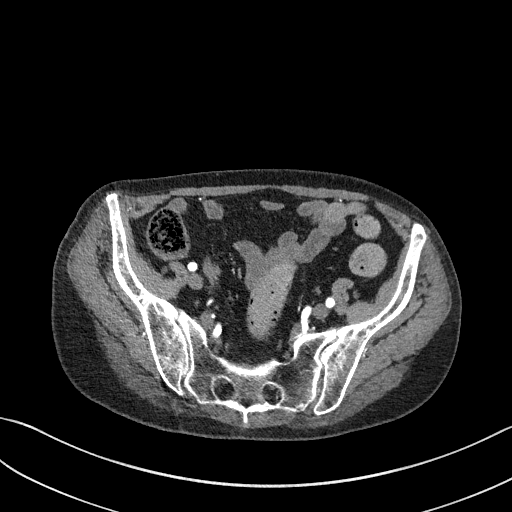
[im 109/209  soft-tissue]
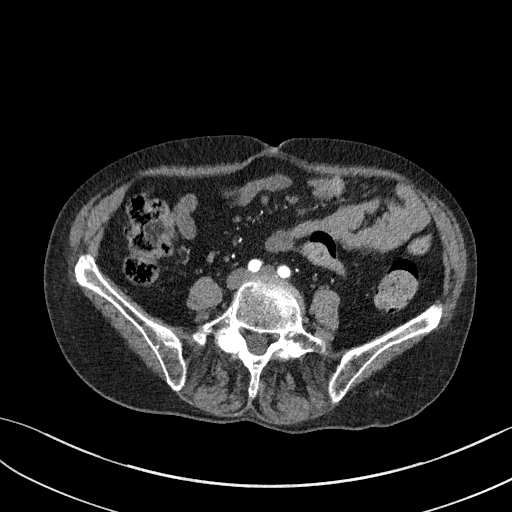
[im 125/209  soft-tissue]
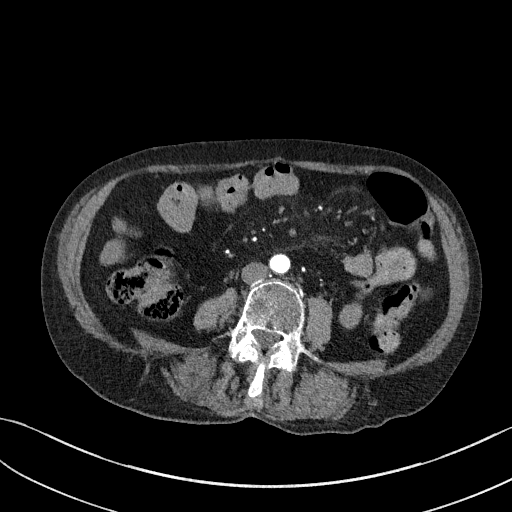
[im 150/209  soft-tissue]
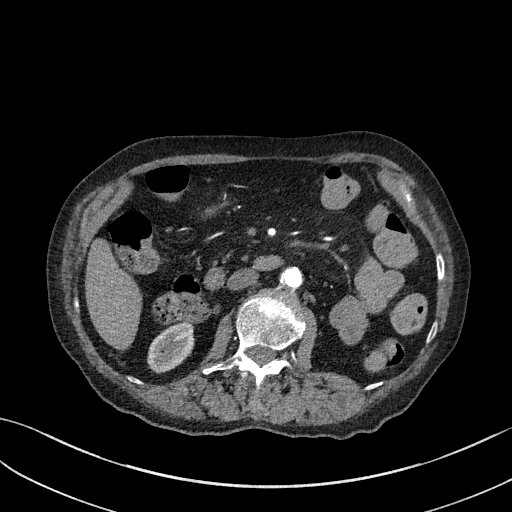
[im 175/209  soft-tissue]
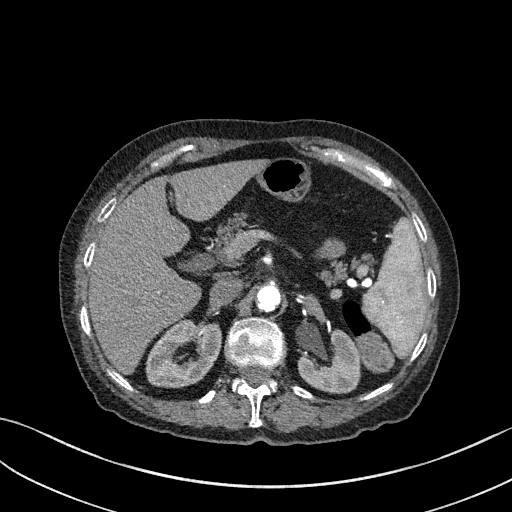
[im 175/209  lung]
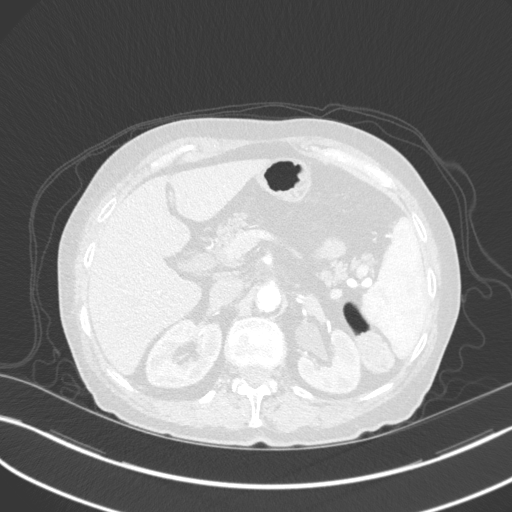
[im 184/209  lung]
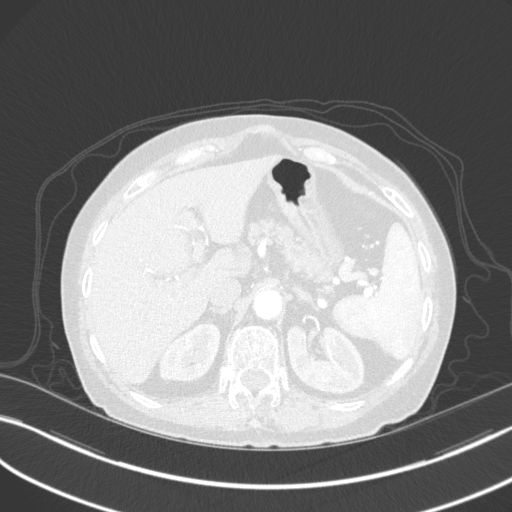
[im 192/209  soft-tissue]
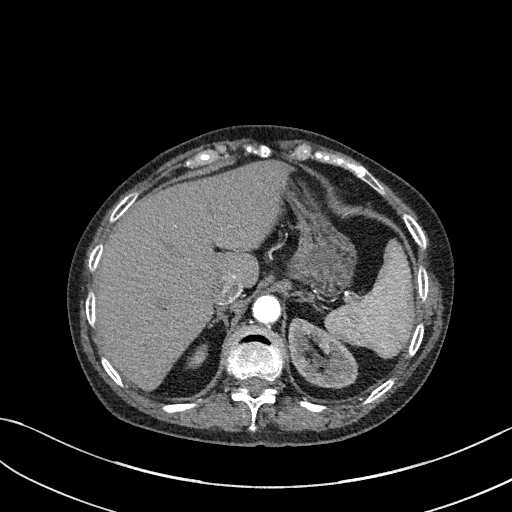
[im 192/209  lung]
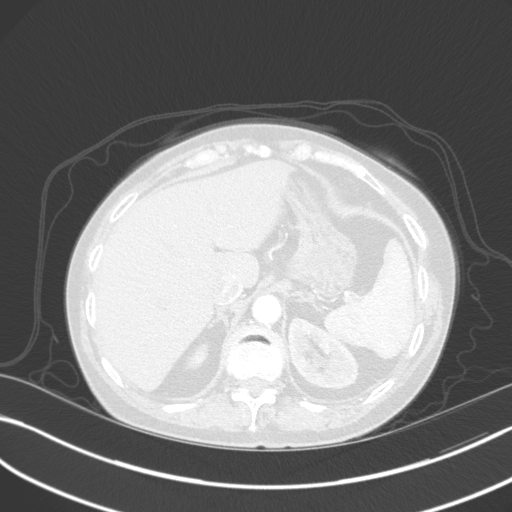
[im 192/209  bone]
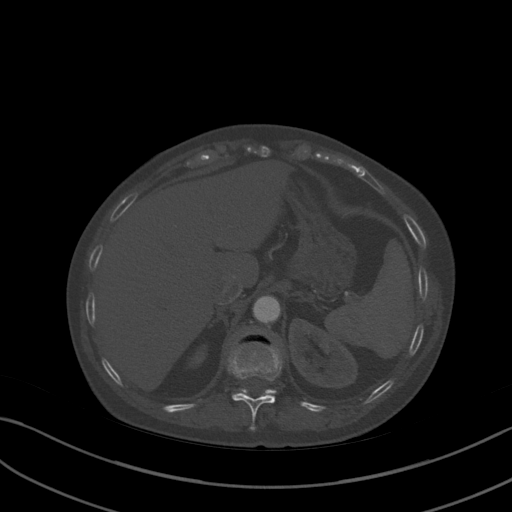
[im 200/209  lung]
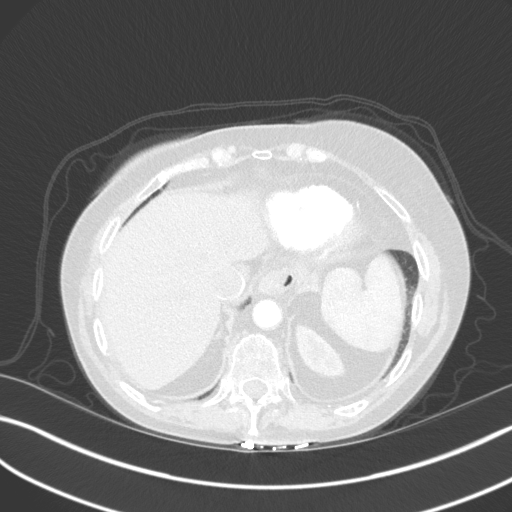

[Series 16: coronal mpr · coronal · 0.68mm/px · 1 of 127 slices shown, 2 images]
[im 64/127  soft-tissue]
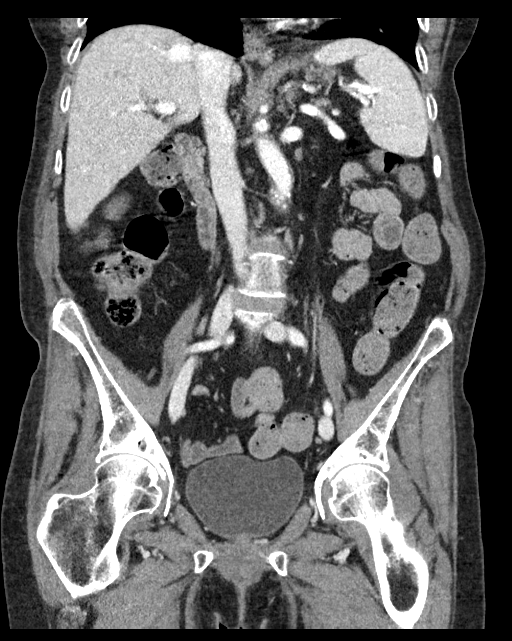
[im 64/127  bone]
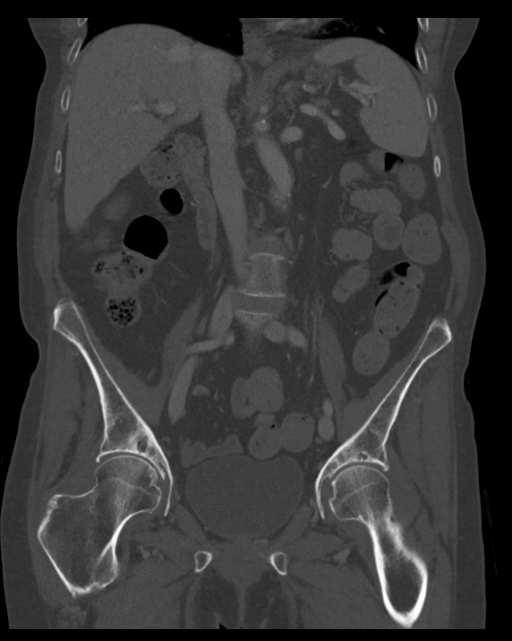

[12 of 46 positions shown; findings below may reference images not displayed]

FINDINGS: VASCULAR

Aorta: Minimal atherosclerosis of the abdominal aorta. The diameter
at the aortic hiatus measures 2.0 cm. No periaortic fluid. No
dissection.

Celiac: Mild atherosclerosis of the celiac artery origin without
evidence of high-grade narrowing.

SMA: SMA demonstrates mild atherosclerosis without evidence of
high-grade stenosis. Replaced right hepatic artery.

Renals:

- Right: Single right renal artery. No significant atherosclerotic
changes.

- Left: Single left renal artery. There is infundibulum at the
origin of the left renal artery, with pre hilar branching pattern.

IMA: IMA is patent.

Right lower extremity:

Unremarkable course, caliber, and contour of the right iliac system.
No aneurysm, dissection, or occlusion. Mild atherosclerosis.
Hypogastric artery is patent. Common femoral artery patent. Proximal
SFA and profunda femoris patent.

Common femoral artery patent. No significant atherosclerosis.
Proximal profunda femoris and SFA patent

Left lower extremity:

Unremarkable course, caliber, and contour of the left iliac system.
No aneurysm, dissection, or occlusion. Mild atherosclerosis.
Hypogastric artery is patent. Common femoral artery patent. Proximal
SFA and profunda femoris patent.

Common femoral artery patent with no significant atherosclerosis.
Proximal profunda femoris and SFA patent.

Veins: Unremarkable appearance of the venous system.

Review of the MIP images confirms the above findings.

NON-VASCULAR

Lower chest: No acute.

Hepatobiliary: Unremarkable appearance of the liver. Cholecystectomy

Pancreas: Unremarkable.

Spleen: Unremarkable.

Adrenals/Urinary Tract:

- Right adrenal gland: Unremarkable

- Left adrenal gland: Unremarkable.

- Right kidney: No hydronephrosis, nephrolithiasis, inflammation, or
ureteral dilation. No focal lesion.

- Left Kidney: No hydronephrosis, nephrolithiasis, inflammation, or
ureteral dilation. No focal lesion.

- Urinary Bladder: Unremarkable.

Stomach/Bowel:

- Stomach: Unremarkable.

- Small bowel: Unremarkable

- Appendix: Appendix is not visualized, however, no inflammatory
changes are present adjacent to the cecum to indicate an
appendicitis.

- Colon: Moderate stool burden.  No focal inflammatory changes.

Lymphatic: No adenopathy.

Mesenteric: No free fluid or air. No mesenteric adenopathy.

Reproductive: Unremarkable uterus. Unremarkable left adnexa.
Low-density cystic lesion associated with the right adnexa measuring
46 mm.

Other: No hernia.

Musculoskeletal: Chronic L2 compression fracture with less than 30%
anterior height loss. No retropulsion of fracture fragments.
Multilevel degenerative changes of the visualized thoracolumbar
spine. Degenerative changes of the bilateral hips. No acute
displaced fracture identified.
IMPRESSION: The mesenteric arteries are patent with no evidence of occlusion,
and no narrowings that would suggest chronic mesenteric ischemia.

Mild aortic atherosclerosis.  Aortic Atherosclerosis ([id number]).

Redemonstration of right adnexal cystic lesion, 4.6 cm. Given the
patient's age, annual surveillance is indicated based on ACR
guidelines. Referral for gynecologic evaluation may be useful to
initiate surveillance strategy, if not already performed.

Additional ancillary findings as above.

## 2022-09-18 ENCOUNTER — Ambulatory Visit: Payer: Medicare Other | Admitting: Cardiology

## 2022-11-12 DIAGNOSIS — I160 Hypertensive urgency: Secondary | ICD-10-CM | POA: Insufficient documentation

## 2022-11-12 NOTE — Progress Notes (Signed)
       Good to see Cardiology Office Note:   Date:  11/13/2022  ID:  Frances Robertson, DOB Jun 14, 1940, MRN [id number] PCP: Gordan Payment., MD   [facility] Providers Cardiologist:  Rollene Rotunda, MD {  History of Present Illness:   Frances Robertson is a 82 y.o. female who presents for evaluation of difficult to control HTN.  Since I last saw her her blood pressure has been well-controlled.  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  She is going to the gym every other day.  She walks and does some light machine work.  The patient denies any new symptoms such as chest discomfort, neck or arm discomfort. There has been no new shortness of breath, PND or orthopnea. There have been no reported palpitations, presyncope or syncope.   ROS: As stated in the HPI and negative for all other systems.  Studies Reviewed:    EKG:     NA   Risk Assessment/Calculations:              Physical Exam:   VS:  BP 122/70 (BP Location: Left Arm, Patient Position: Sitting, Cuff Size: Normal)   Pulse 81   Ht 5' (1.524 m)   Wt 144 lb 9.6 oz (65.6 kg)   SpO2 93%   BMI 28.24 kg/m    Wt Readings from Last 3 Encounters:  11/13/22 144 lb 9.6 oz (65.6 kg)  05/20/22 137 lb 9.6 oz (62.4 kg)  09/04/21 124 lb 4 oz (56.4 kg)     GEN: Well nourished, well developed in no acute distress NECK: No JVD; No carotid bruits CARDIAC: RRR, no murmurs, rubs, gallops RESPIRATORY:  Clear to auscultation without rales, wheezing or rhonchi  ABDOMEN: Soft, non-tender, non-distended EXTREMITIES:  No edema; No deformity   ASSESSMENT AND PLAN:   HTN: Blood pressure is well-controlled on the meds as listed.  No change in therapy.     Follow up with me in one year.   Signed, Rollene Rotunda, MD

## 2022-11-13 ENCOUNTER — Ambulatory Visit: Payer: Medicare Other | Attending: Cardiology | Admitting: Cardiology

## 2022-11-13 ENCOUNTER — Encounter: Payer: Self-pay | Admitting: Cardiology

## 2022-11-13 VITALS — BP 122/70 | HR 81 | Ht 60.0 in | Wt 144.6 lb

## 2022-11-13 DIAGNOSIS — I160 Hypertensive urgency: Secondary | ICD-10-CM

## 2022-11-13 NOTE — Patient Instructions (Signed)

## 2023-04-05 ENCOUNTER — Telehealth: Payer: Self-pay | Admitting: Cardiology

## 2023-04-05 NOTE — Telephone Encounter (Signed)
  Pt said, she was in ED at [facility]. She was told by to call her heart doctor because they don't know where the fluid is coming from. She would like to see Dr. Antoine Poche only.

## 2023-04-05 NOTE — Telephone Encounter (Signed)
Called and spoke to patient. Verified name and DOB. Patient reports being in the ED for SOB and edema in at [facility] last week. She stated she is feeling better but they did not know where the fluid was coming from. They suggested she contact the office. She deny edema and SOB at this time. Patient wanted to know if she needed to come in for a follow up. Advised patient she should be seen for a follow up and she stated she was not having and edema or shortness of breath at this time, but wanted to see if Dr Antoine Poche thought she needed to follow up in the office. Please advise.

## 2023-04-05 NOTE — Telephone Encounter (Signed)
Called and spoke to patient. Below message relayed to patient. Per Dr Antoine Poche. Patient stated she will finish her Lasix she was given in the ED and call back if she starts to have symptoms again.  She could come into see an APP if she has continued symptoms.  Otherwise, if she is back to baseline OK to hold off on office visit.

## 2023-04-27 ENCOUNTER — Ambulatory Visit: Payer: Medicare Other | Admitting: Pediatrics

## 2023-05-23 ENCOUNTER — Other Ambulatory Visit: Payer: Self-pay | Admitting: Cardiology

## 2023-05-26 ENCOUNTER — Ambulatory Visit: Payer: Medicare Other | Admitting: Pediatrics

## 2024-03-26 ENCOUNTER — Ambulatory Visit (HOSPITAL_BASED_OUTPATIENT_CLINIC_OR_DEPARTMENT_OTHER): Admit: 2024-03-26 | Discharge: 2024-03-26 | Disposition: A | Discharge status: Home / Self Care | Admitting: Radiology

## 2024-03-26 ENCOUNTER — Ambulatory Visit (HOSPITAL_BASED_OUTPATIENT_CLINIC_OR_DEPARTMENT_OTHER): Admission: EM | Admit: 2024-03-26 | Discharge: 2024-03-26 | Disposition: A | Discharge status: Home / Self Care

## 2024-03-26 ENCOUNTER — Encounter (HOSPITAL_BASED_OUTPATIENT_CLINIC_OR_DEPARTMENT_OTHER): Payer: Self-pay

## 2024-03-26 DIAGNOSIS — R051 Acute cough: Secondary | ICD-10-CM

## 2024-03-26 DIAGNOSIS — M549 Dorsalgia, unspecified: Secondary | ICD-10-CM

## 2024-03-26 DIAGNOSIS — R0602 Shortness of breath: Secondary | ICD-10-CM

## 2024-03-26 DIAGNOSIS — J189 Pneumonia, unspecified organism: Secondary | ICD-10-CM

## 2024-03-26 LAB — POCT INFLUENZA A/B
Influenza A, POC: NEGATIVE
Influenza B, POC: NEGATIVE

## 2024-03-26 MED ORDER — COMPACT SPACE CHAMBER DEVI: 0 refills | Status: AC

## 2024-03-26 MED ORDER — DOXYCYCLINE HYCLATE 100 MG PO CAPS: 100.0000 mg | ORAL_CAPSULE | Freq: Two times a day (BID) | ORAL | 0 refills | Status: AC

## 2024-03-26 MED ORDER — ALBUTEROL SULFATE HFA 108 (90 BASE) MCG/ACT IN AERS: 2.0000 | INHALATION_SPRAY | RESPIRATORY_TRACT | 0 refills | Status: AC | PRN

## 2024-03-26 MED ORDER — PROMETHAZINE-DM 6.25-15 MG/5ML PO SYRP: 5.0000 mL | ORAL_SOLUTION | Freq: Four times a day (QID) | ORAL | 0 refills | Status: AC | PRN

## 2024-03-26 NOTE — ED Triage Notes (Signed)
 Pt c/o cough shortness of breathe and upper back pain started this morning. Denies fever ear pain sore throat. Pt has hx of pneumonia. Has not taken any medications.

## 2024-03-26 NOTE — Discharge Instructions (Addendum)
 Community-acquired pneumonia with shortness of breath, cough and upper rib pain: Exam shows significantly decreased sounds, worse on the right with some wheezing.  Patient has a significant allergy to cephalexin.  Ceftriaxone not given due to her cephalexin allergy.  Doxycycline  100 mg twice daily for 10 days.  Albuterol  inhaler with spacer, 2 puffs, every 4 hours if needed for wheezing.  Promethazine  DM, 5 mL, every 6 hours if needed for cough.  Get plenty of fluids and rest.  Make an appointment with primary care and recheck in 3 weeks or sooner as needed.

## 2024-03-26 NOTE — ED Provider Notes (Signed)
 [facility]    CSN: [id number] Arrival date & time: 03/26/24  0815      History   Chief Complaint Chief Complaint  Patient presents with   Cough    HPI Frances Robertson is a 83 y.o. female.   83 year old female here with her adult daughter.  Patient reports she had pneumonia in July.  She started with acute cough, shortness of breath and upper back pain that is worse with deep breaths or cough.  She denies fever, chills, nausea, vomiting, constipation, diarrhea.   Cough Associated symptoms: shortness of breath   Associated symptoms: no chest pain, no chills, no ear pain, no fever, no rash and no sore throat     Past Medical History:  Diagnosis Date   Anemia, unspecified    Arthritis    Cataract    GERD (gastroesophageal reflux disease)    takes Omeprazole daily   Insomnia    takes Ambien  nightly   Low back pain    herniated disc   PAC (premature atrial contraction)    Pneumonia hx of-as a child   PONV (postoperative nausea and vomiting)    Unspecified hypothyroidism    takes Synthroid  daily    Patient Active Problem List   Diagnosis Date Noted   Hypertensive urgency 11/12/2022   Essential hypertension 05/18/2022   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   Lumbar herniated disc 07/23/2014   Low back pain 07/12/2014   Intractable migraine without aura 09/14/2012   PAC (premature atrial contraction) 03/21/2012    Past Surgical History:  Procedure Laterality Date   CARPAL TUNNEL RELEASE Bilateral    CATARACT EXTRACTION Bilateral    CHOLECYSTECTOMY     ESOPHAGOGASTRODUODENOSCOPY     LUMBAR LAMINECTOMY/DECOMPRESSION MICRODISCECTOMY Right 07/23/2014   Procedure: Right Lumbar four-five microdiskectomy;  Surgeon: Reyes Budge, MD;  Location: [facility];  Service: Neurosurgery;  Laterality: Right;  Right Lumbar four-five microdiskectomy   TUBAL LIGATION Bilateral     OB History   No obstetric history on file.      Home  Medications    Prior to Admission medications  Medication Sig Start Date End Date Taking? Authorizing Provider  albuterol  (VENTOLIN  HFA) 108 (90 Base) MCG/ACT inhaler Inhale 2 puffs into the lungs every 4 (four) hours as needed for wheezing or shortness of breath. 03/26/24  Yes Ival Domino, FNP  aspirin  EC 81 MG tablet Take 1 tablet (81 mg total) by mouth every other day. Swallow whole. 05/20/22  Yes Lynwood Schilling, MD  doxycycline  (VIBRAMYCIN ) 100 MG capsule Take 1 capsule (100 mg total) by mouth 2 (two) times daily for 10 days. 03/26/24 04/05/24 Yes Ival Domino, FNP  promethazine  (PHENERGAN ) 25 MG tablet Take 25 mg by mouth. 10/18/23  Yes [provider]  promethazine -dextromethorphan (PROMETHAZINE -DM) 6.25-15 MG/5ML syrup Take 5 mLs by mouth 4 (four) times daily as needed for cough. Do not use and drive - May make drowsy. 03/26/24  Yes Ival Domino, FNP  Spacer/Aero-Holding Chambers (COMPACT SPACE CHAMBER) DEVI Use with the albuterol  inhaler 03/26/24  Yes Ival Domino, FNP  alendronate (FOSAMAX) 70 MG tablet Take 70 mg by mouth once a week.    [provider]  atenolol  (TENORMIN ) 100 MG tablet Take 100 mg by mouth daily.    [provider]  Cholecalciferol (D 1000) 25 MCG (1000 UT) capsule Take 1,000 Units by mouth daily. 08/30/18   [provider]  cyproheptadine (PERIACTIN) 4 MG tablet Take 4 mg by mouth  2 (two) times daily as needed.    [provider]  esomeprazole  (NEXIUM ) 40 MG capsule Take 1 capsule (40 mg total) by mouth 2 (two) times daily. Patient taking differently: Take 80 mg by mouth every morning. 01/10/21   Aneita Gwendlyn DASEN, MD  ferrous sulfate  325 (65 FE) MG EC tablet Take 1 tablet (325 mg total) by mouth daily with breakfast. 05/30/21 11/13/22  Aneita Gwendlyn DASEN, MD  gabapentin  (NEURONTIN ) 300 MG capsule Take 2 capsules (600 mg total) by mouth 3 (three) times daily. 03/25/21   Rush Nest, MD  hydrALAZINE  (APRESOLINE ) 25 MG tablet  TAKE 1 TABLET BY MOUTH THREE TIMES DAILY 05/25/23   Lynwood Schilling, MD  levothyroxine  (SYNTHROID ) 112 MCG tablet Take 1 tablet by mouth daily. 10/08/22   [provider]  losartan (COZAAR) 100 MG tablet Take 100 mg by mouth daily. 05/15/22   [provider]  methylPREDNISolone  (MEDROL  DOSEPAK) 4 MG TBPK tablet Take as directed by packaging 06/30/21   Rush Nest, MD  NURTEC 75 MG TBDP Place 1 tablet under the tongue daily as needed. 08/14/21   [provider]  rizatriptan (MAXALT) 5 MG tablet Take 1 tablet by mouth as needed. 09/02/21   [provider]  topiramate  (TOPAMAX ) 50 MG tablet TAKE 3 TABLETS BY MOUTH AT BEDTIME 08/26/21   Rush Nest, MD  traMADol  (ULTRAM ) 50 MG tablet Tramadol  taper: Take 1 pill every 8 hours for one week. Then take one pill in AM, 0.5 pill in afternoon, and 1 pill in PM for one week. Then take one pill twice a day for one week. Then take 0.5 pill in AM and 1 pill in PM for one week. Then take 1 pill daily for one week. Then take one pill every other day for one week. Then take 0.5 pill every other day for one week. Then stop. 02/28/21   Rush Nest, MD  zolpidem  (AMBIEN ) 10 MG tablet Take 10 mg by mouth at bedtime.     [provider]    Family History Family History  Problem Relation Age of Onset   Venous thrombosis Mother 41       vague history of this vs aneurysm somewhere   Heart attack Mother    Stroke Father 78       Sisters x 2 with breast cancer, brothers x 2 died with lung cancer   Breast cancer Sister    Cancer - Lung Brother        smoker   Cancer - Lung Brother        smoker    Sudden death Son 90       stroke   Colon cancer Neg Hx    Esophageal cancer Neg Hx    Stomach cancer Neg Hx    Rectal cancer Neg Hx    Pancreatic cancer Neg Hx     Social History Social History[1]   Allergies   Benzalkonium chloride, Hydrocodone-acetaminophen , Piroxicam, Celandine [chelidonium majus], Celebrex  [celecoxib], Cephalexin, Famotidine, Hydrocodone, Neosporin [neomycin-bacitracin zn-polymyx], Other, Oxycodone, and Sucralfate   Review of Systems Review of Systems  Constitutional:  Negative for chills and fever.  HENT:  Negative for ear pain and sore throat.   Eyes:  Negative for pain and visual disturbance.  Respiratory:  Positive for cough and shortness of breath.   Cardiovascular:  Negative for chest pain and palpitations.  Gastrointestinal:  Negative for abdominal pain, constipation, diarrhea, nausea and vomiting.  Genitourinary:  Negative for dysuria and hematuria.  Musculoskeletal:  Positive for back pain. Negative for arthralgias.  Skin:  Negative for color change and rash.  Neurological:  Negative for seizures and syncope.  All other systems reviewed and are negative.    Physical Exam Triage Vital Signs ED Triage Vitals  Encounter Vitals Group     BP 03/26/24 0845 (!) 168/74     Girls Systolic BP Percentile --      Girls Diastolic BP Percentile --      Boys Systolic BP Percentile --      Boys Diastolic BP Percentile --      Pulse Rate 03/26/24 0845 83     Resp 03/26/24 0845 18     Temp 03/26/24 0845 98.7 F (37.1 C)     Temp Source 03/26/24 0845 Oral     SpO2 03/26/24 0845 94 %     Weight --      Height --      Head Circumference --      Peak Flow --      Pain Score 03/26/24 0842 5     Pain Loc --      Pain Education --      Exclude from Growth Chart --    No data found.  Updated Vital Signs BP (!) 168/74 (BP Location: Right Arm)   Pulse 83   Temp 98.7 F (37.1 C) (Oral)   Resp 18   SpO2 94%   Visual Acuity Right Eye Distance:   Left Eye Distance:   Bilateral Distance:    Right Eye Near:   Left Eye Near:    Bilateral Near:     Physical Exam Vitals and nursing note reviewed.  Constitutional:      General: She is not in acute distress.    Appearance: She is well-developed. She is not ill-appearing, toxic-appearing or diaphoretic.  HENT:      Head: Normocephalic and atraumatic.     Right Ear: Hearing, tympanic membrane, ear canal and external ear normal.     Left Ear: Hearing, tympanic membrane, ear canal and external ear normal.     Nose: Congestion and rhinorrhea present. Rhinorrhea is clear.     Right Sinus: No maxillary sinus tenderness or frontal sinus tenderness.     Left Sinus: No maxillary sinus tenderness or frontal sinus tenderness.     Mouth/Throat:     Lips: Pink.     Mouth: Mucous membranes are moist.     Pharynx: Uvula midline. No oropharyngeal exudate or posterior oropharyngeal erythema.     Tonsils: No tonsillar exudate.  Eyes:     Conjunctiva/sclera: Conjunctivae normal.     Pupils: Pupils are equal, round, and reactive to light.  Cardiovascular:     Rate and Rhythm: Normal rate and regular rhythm.     Heart sounds: S1 normal and S2 normal. No murmur heard. Pulmonary:     Effort: Pulmonary effort is normal. No respiratory distress.     Breath sounds: Examination of the right-upper field reveals wheezing. Examination of the left-upper field reveals wheezing. Examination of the right-lower field reveals decreased breath sounds. Examination of the left-lower field reveals decreased breath sounds. Decreased breath sounds (Decreased breath sounds bilaterally in the bases.) and wheezing (Rare, intermittent wheeze.) present. No rhonchi or rales.  Abdominal:     General: Bowel sounds are normal.     Palpations: Abdomen is soft.     Tenderness: There is no abdominal tenderness.  Musculoskeletal:        General: No  swelling.     Cervical back: Neck supple.  Lymphadenopathy:     Head:     Right side of head: No submental, submandibular, tonsillar, preauricular or posterior auricular adenopathy.     Left side of head: No submental, submandibular, tonsillar, preauricular or posterior auricular adenopathy.     Cervical: No cervical adenopathy.     Right cervical: No superficial cervical adenopathy.    Left cervical: No  superficial cervical adenopathy.  Skin:    General: Skin is warm and dry.     Capillary Refill: Capillary refill takes less than 2 seconds.     Findings: No rash.  Neurological:     Mental Status: She is alert and oriented to person, place, and time.  Psychiatric:        Mood and Affect: Mood normal.      UC Treatments / Results  Labs (all labs ordered are listed, but only abnormal results are displayed) Labs Reviewed  POCT INFLUENZA A/B - Normal    EKG   Radiology DG Chest 2 View Result Date: 03/26/2024 CLINICAL DATA:  Cough. EXAM: DG CHEST 2V COMPARISON:  11/16/2023 FINDINGS: Patchy right upper lobe airspace disease is new in the interval. There may be some airspace opacity in the right lower lobe as well. Left lung clear. Cardiopericardial silhouette is at upper limits of normal for size. Thoracolumbar scoliosis evident. Bones are demineralized. IMPRESSION: Patchy right upper lobe airspace disease with possible airspace opacity in the right lower lobe. Imaging features suggest multifocal pneumonia. Follow-up imaging recommended to ensure resolution. Electronically Signed   By: Camellia Candle M.D.   On: 03/26/2024 09:44    Procedures Procedures (including critical care time)  Medications Ordered in UC Medications - No data to display  Initial Impression / Assessment and Plan / UC Course  I have reviewed the triage vital signs and the nursing notes.  Pertinent labs & imaging results that were available during my care of the patient were reviewed by me and considered in my medical decision making (see chart for details).  Plan of Care (see discharge instructions for additional patient precautions and education): Community-acquired pneumonia with shortness of breath, cough and upper rib pain:  Exam shows significantly decreased sounds, worse on the right with some wheezing.  Patient has a significant allergy to cephalexin.  Ceftriaxone not given due to her cephalexin allergy.    Doxycycline  100 mg twice daily for 10 days.   Albuterol  inhaler with spacer, 2 puffs, every 4 hours if needed for wheezing.   Promethazine  DM, 5 mL, every 6 hours if needed for cough.   Get plenty of fluids and rest. Make an appointment with primary care and recheck in 3 weeks or sooner as needed.  I reviewed the plan of care with the patient and/or the patient's guardian.  The patient and/or guardian had time to ask questions and acknowledged that the questions were answered.  Final Clinical Impressions(s) / UC Diagnoses   Final diagnoses:  Acute cough  Acute upper back pain  Shortness of breath  Community acquired pneumonia, unspecified laterality     Discharge Instructions      Community-acquired pneumonia with shortness of breath, cough and upper rib pain: Exam shows significantly decreased sounds, worse on the right with some wheezing.  Patient has a significant allergy to cephalexin.  Ceftriaxone not given due to her cephalexin allergy.  Doxycycline  100 mg twice daily for 10 days.  Albuterol  inhaler with spacer, 2 puffs, every 4 hours if  needed for wheezing.  Promethazine  DM, 5 mL, every 6 hours if needed for cough.  Get plenty of fluids and rest.  Make an appointment with primary care and recheck in 3 weeks or sooner as needed.     ED Prescriptions     Medication Sig Dispense Auth. Provider   doxycycline  (VIBRAMYCIN ) 100 MG capsule Take 1 capsule (100 mg total) by mouth 2 (two) times daily for 10 days. 20 capsule Ival Domino, FNP   promethazine -dextromethorphan (PROMETHAZINE -DM) 6.25-15 MG/5ML syrup Take 5 mLs by mouth 4 (four) times daily as needed for cough. Do not use and drive - May make drowsy. 118 mL Ival Domino, FNP   albuterol  (VENTOLIN  HFA) 108 (90 Base) MCG/ACT inhaler Inhale 2 puffs into the lungs every 4 (four) hours as needed for wheezing or shortness of breath. 1 each Ival Domino, FNP   Spacer/Aero-Holding Chambers (COMPACT SPACE CHAMBER) DEVI Use with  the albuterol  inhaler 1 each Ival Domino, FNP      PDMP not reviewed this encounter.    [1]  Social History Tobacco Use   Smoking status: Never   Smokeless tobacco: Never  Vaping Use   Vaping status: Never Used  Substance Use Topics   Alcohol use: No   Drug use: No     Ival Domino, FNP 03/26/24 [phone]

## 2024-04-27 NOTE — Progress Notes (Unsigned)
" °  Cardiology Office Note:   Date:  04/28/2024  ID:  [name], DOB 07-29-1940, MRN [id number] PCP: Thurmond Cathlyn LABOR., MD  [facility] Providers Cardiologist:  Lynwood Schilling, MD {  History of Present Illness:   Frances Robertson is a 84 y.o. female  who presents for evaluation of difficult to control HTN.  Since I last saw her she has had some increase in blood pressures.  She has felt up few times like her head was maybe full and she noted her blood pressure to be elevated.  She had 1 systolic reading she recalls being in the 170s.  We talked about taking some extra hydralazine  at that point and she did do this on 1 or 2 occasions.  She says it typically the blood pressure is well-controlled.  She might get a little blurry vision when it starts to get elevated.  She thinks it has been a little higher in general over the last couple of weeks but she cannot really quantify this.  She works as a engineer, structural for an elderly couple.  She denies any new cardiovascular symptoms such as chest pressure, neck or arm discomfort.  She denies any palpitations, presyncope or syncope.  She has had no weight gain or edema.  ROS: As stated in the HPI and negative for all other systems.  Studies Reviewed:    EKG:   EKG Interpretation Date/Time:  Friday April 28 2024 13:35:16 EST Ventricular Rate:  78 PR Interval:  140 QRS Duration:  72 QT Interval:  388 QTC Calculation: 442 R Axis:   79  Text Interpretation: Normal sinus rhythm Low voltage QRS Nonspecific ST abnormality When compared with ECG of 18-Nov-2009 14:41, Premature atrial complexes are no longer Present Confirmed by Schilling Rattan ([id number]) on 04/28/2024 1:49:09 PM NA  Risk Assessment/Calculations:    Physical Exam:   VS:  BP (!) 154/76 (BP Location: Left Arm, Patient Position: Sitting, Cuff Size: Normal)   Pulse 78   Ht 5' (1.524 m)   Wt 142 lb (64.4 kg)   SpO2 96%   BMI 27.73 kg/m    Wt Readings from Last 3 Encounters:  04/28/24 142  lb (64.4 kg)  11/13/22 144 lb 9.6 oz (65.6 kg)  05/20/22 137 lb 9.6 oz (62.4 kg)     GEN: Well nourished, well developed in no acute distress NECK: No JVD; No carotid bruits CARDIAC: RRR, no murmurs, rubs, gallops RESPIRATORY:  Clear to auscultation without rales, wheezing or rhonchi  ABDOMEN: Soft, non-tender, non-distended EXTREMITIES:  No edema; No deformity   ASSESSMENT AND PLAN:   HTN: The patient's blood pressure is overall she thinks reasonably controlled but creeping up.  A little bit elevated today.  She is going to keep an eye on this.  We talked again about as needed dosing of her hydralazine  and I think she feels more comfortable to use this after this conversation.  However, she knows that if she is starting to use that more frequently she needs to let me know at which point I would probably increase the standing dose.   Follow up with me in 1 year or sooner if needed.  Signed, Lynwood Schilling, MD   "

## 2024-04-28 ENCOUNTER — Ambulatory Visit: Attending: Cardiology | Admitting: Cardiology

## 2024-04-28 ENCOUNTER — Encounter: Payer: Self-pay | Admitting: Cardiology

## 2024-04-28 VITALS — BP 154/76 | HR 78 | Ht 60.0 in | Wt 142.0 lb

## 2024-04-28 DIAGNOSIS — I10 Essential (primary) hypertension: Secondary | ICD-10-CM | POA: Diagnosis not present

## 2024-04-28 NOTE — Patient Instructions (Signed)

## 2024-05-01 ENCOUNTER — Telehealth: Payer: Self-pay | Admitting: Cardiology

## 2024-05-01 NOTE — Telephone Encounter (Signed)
 Pt c/o medication issue:  1. Name of Medication: hydrALAZINE  (APRESOLINE ) 25 MG tablet   2. How are you currently taking this medication (dosage and times per day)? As written  3. Are you having a reaction (difficulty breathing--STAT)? No  4. What is your medication issue? Patient stated during her 04/28/24 visit it was discussed that this prescription could be increased. Patient is requesting a dosage increase

## 2024-05-01 NOTE — Telephone Encounter (Signed)
 Spoke with pt. Advised her I would get the message over to Dr Lavona.Pt stated understanding

## 2024-05-05 NOTE — Telephone Encounter (Signed)
 Spoke with pt and advised her to keep a blood pressure diary for 10 days, taking her blood pressure twice daily and send us  the readings. Pt verbalized understanding. All questions if any were answered.
